# Patient Record
Sex: Male | Born: 1985 | Race: White | Hispanic: No | Marital: Married | Smoking: Never smoker
Health system: Southern US, Community
[De-identification: ages and names within clinical notes are randomized; demographics above are authoritative.]

---

## 2003-10-12 ENCOUNTER — Emergency Department (HOSPITAL_COMMUNITY): Admission: EM | Admit: 2003-10-12 | Discharge: 2003-10-13 | Payer: Self-pay | Admitting: Emergency Medicine

## 2005-11-29 ENCOUNTER — Emergency Department (HOSPITAL_COMMUNITY): Admission: EM | Admit: 2005-11-29 | Discharge: 2005-11-29 | Payer: Self-pay | Admitting: Emergency Medicine

## 2008-03-20 ENCOUNTER — Emergency Department (HOSPITAL_COMMUNITY): Admission: EM | Admit: 2008-03-20 | Discharge: 2008-03-20 | Payer: Self-pay | Admitting: Family Medicine

## 2008-04-01 ENCOUNTER — Encounter: Admission: RE | Admit: 2008-04-01 | Discharge: 2008-05-11 | Payer: Self-pay | Admitting: Orthopedic Surgery

## 2009-01-28 ENCOUNTER — Emergency Department (HOSPITAL_COMMUNITY): Admission: EM | Admit: 2009-01-28 | Discharge: 2009-01-28 | Payer: Self-pay | Admitting: Emergency Medicine

## 2010-04-01 ENCOUNTER — Emergency Department (HOSPITAL_COMMUNITY)
Admission: EM | Admit: 2010-04-01 | Discharge: 2010-04-01 | Payer: Self-pay | Source: Home / Self Care | Admitting: Family Medicine

## 2010-08-29 ENCOUNTER — Inpatient Hospital Stay (INDEPENDENT_AMBULATORY_CARE_PROVIDER_SITE_OTHER)
Admission: RE | Admit: 2010-08-29 | Discharge: 2010-08-29 | Disposition: A | Discharge status: Home / Self Care | Payer: BC Managed Care – PPO | Source: Ambulatory Visit | Attending: Family Medicine | Admitting: Family Medicine

## 2010-08-29 DIAGNOSIS — H9209 Otalgia, unspecified ear: Secondary | ICD-10-CM

## 2010-08-29 DIAGNOSIS — S058X9A Other injuries of unspecified eye and orbit, initial encounter: Secondary | ICD-10-CM

## 2010-08-29 DIAGNOSIS — J069 Acute upper respiratory infection, unspecified: Secondary | ICD-10-CM

## 2015-03-10 ENCOUNTER — Other Ambulatory Visit: Payer: Self-pay | Admitting: Family Medicine

## 2015-03-10 ENCOUNTER — Ambulatory Visit
Admission: RE | Admit: 2015-03-10 | Discharge: 2015-03-10 | Disposition: A | Discharge status: Home / Self Care | Payer: BLUE CROSS/BLUE SHIELD | Source: Ambulatory Visit | Attending: Family Medicine | Admitting: Family Medicine

## 2015-03-10 DIAGNOSIS — R52 Pain, unspecified: Secondary | ICD-10-CM

## 2015-03-10 DIAGNOSIS — M25531 Pain in right wrist: Secondary | ICD-10-CM | POA: Insufficient documentation

## 2015-10-28 ENCOUNTER — Encounter: Payer: Self-pay | Admitting: Emergency Medicine

## 2015-10-28 ENCOUNTER — Emergency Department: Payer: Worker's Compensation

## 2015-10-28 ENCOUNTER — Emergency Department
Admission: EM | Admit: 2015-10-28 | Discharge: 2015-10-28 | Disposition: A | Discharge status: Home / Self Care | Payer: Worker's Compensation | Attending: Emergency Medicine | Admitting: Emergency Medicine

## 2015-10-28 DIAGNOSIS — Y939 Activity, unspecified: Secondary | ICD-10-CM | POA: Insufficient documentation

## 2015-10-28 DIAGNOSIS — Y929 Unspecified place or not applicable: Secondary | ICD-10-CM | POA: Diagnosis not present

## 2015-10-28 DIAGNOSIS — S5011XA Contusion of right forearm, initial encounter: Secondary | ICD-10-CM | POA: Insufficient documentation

## 2015-10-28 DIAGNOSIS — Y999 Unspecified external cause status: Secondary | ICD-10-CM | POA: Diagnosis not present

## 2015-10-28 DIAGNOSIS — X58XXXA Exposure to other specified factors, initial encounter: Secondary | ICD-10-CM | POA: Insufficient documentation

## 2015-10-28 DIAGNOSIS — R2231 Localized swelling, mass and lump, right upper limb: Secondary | ICD-10-CM | POA: Diagnosis present

## 2015-10-28 DIAGNOSIS — T754XXA Electrocution, initial encounter: Secondary | ICD-10-CM | POA: Insufficient documentation
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
## 2015-11-16 ENCOUNTER — Other Ambulatory Visit: Payer: Self-pay | Admitting: Family

## 2015-11-16 ENCOUNTER — Ambulatory Visit
Admission: RE | Admit: 2015-11-16 | Discharge: 2015-11-16 | Disposition: A | Discharge status: Home / Self Care | Payer: Worker's Compensation | Source: Ambulatory Visit | Attending: Family | Admitting: Family

## 2015-11-16 DIAGNOSIS — M25512 Pain in left shoulder: Secondary | ICD-10-CM | POA: Diagnosis present

## 2015-11-16 DIAGNOSIS — R52 Pain, unspecified: Secondary | ICD-10-CM

## 2015-12-15 DIAGNOSIS — M25519 Pain in unspecified shoulder: Secondary | ICD-10-CM | POA: Insufficient documentation
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
## 2017-12-25 ENCOUNTER — Other Ambulatory Visit: Payer: Self-pay | Admitting: Physician Assistant

## 2017-12-25 DIAGNOSIS — N5089 Other specified disorders of the male genital organs: Secondary | ICD-10-CM

## 2018-01-01 ENCOUNTER — Ambulatory Visit: Payer: BLUE CROSS/BLUE SHIELD

## 2018-08-21 ENCOUNTER — Other Ambulatory Visit: Payer: Self-pay

## 2018-08-21 ENCOUNTER — Telehealth: Payer: Self-pay | Admitting: *Deleted

## 2018-08-21 DIAGNOSIS — Z20822 Contact with and (suspected) exposure to covid-19: Secondary | ICD-10-CM

## 2018-08-21 NOTE — Telephone Encounter (Signed)
Pt called and scheduled for testing at [facility] site on 08/21/18 at 3:15 pm. Pt advised to wear a mask and remain in the car at the time of appt. Understanding verbalized.
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
## 2018-08-23 LAB — NOVEL CORONAVIRUS, NAA: SARS-CoV-2, NAA: NOT DETECTED

## 2018-11-22 DIAGNOSIS — Z23 Encounter for immunization: Secondary | ICD-10-CM | POA: Diagnosis not present

## 2018-11-26 ENCOUNTER — Ambulatory Visit: Payer: 59 | Admitting: Internal Medicine

## 2018-11-26 ENCOUNTER — Other Ambulatory Visit: Payer: Self-pay

## 2018-11-26 ENCOUNTER — Encounter: Payer: Self-pay | Admitting: Internal Medicine

## 2018-11-26 VITALS — BP 127/90 | HR 75 | Temp 98.3°F | Resp 12 | Ht 75.0 in | Wt 229.0 lb

## 2018-11-26 DIAGNOSIS — H93239 Hyperacusis, unspecified ear: Secondary | ICD-10-CM | POA: Insufficient documentation

## 2018-11-26 DIAGNOSIS — H6121 Impacted cerumen, right ear: Secondary | ICD-10-CM

## 2018-11-26 DIAGNOSIS — H6123 Impacted cerumen, bilateral: Secondary | ICD-10-CM

## 2018-11-26 NOTE — Progress Notes (Signed)
S - 33 y.o. male who presents with right ear blockage, not had irrigated in the past here but was told on his last physical by Dr.R that there was some wax blocking and he bought the PTC drops and used with the bulb syringe and not all that helpful. Now notes more blockage and decreased hearing from the right ear. Has not used drops in the left ear.  Not painful. Denies PND, sinus congestion, sore throat, or cough no fevers, no sx's of concern for Covid Hearing decreased some from the right ear and not noticed in left Uses ear plugs at work at times, does not use Q-tips  No Known Allergies  No current outpatient medications on file prior to visit.   No current facility-administered medications on file prior to visit.    No h/o high BP Never smoker  O - NAD, masked  BP 127/90 (BP Location: Right Arm, Patient Position: Sitting, Cuff Size: Large)   Pulse 75   Temp 98.3 F (36.8 C) (Oral)   Resp 12   Ht '6\' 3"'  (1.905 m)   Wt 229 lb (103.9 kg)   SpO2 100%   BMI 28.62 kg/m   BP on last PE 2019 was 110/78  HEENT - conj - non-inj'ed,  Left TM with cerumen, not visualize TM, Right TM also not visualized with cerumen in canal  NT tugging on Right lobe, NT tugging on Left lobe Neck - no subauricular adenopathy, no increased anterior cervical nodes or posterior cervical nodes, no rigidity Affect not flat, approp with conversation  Ass/Plan 1: cerumen impaction - bilat  Discussed the procedure of irrigation with warm water with device have in office and patient agreed to proceed and this was done without incident on both the right and left ears A large amount of cerumen was successfully irrigated from both ears Exam after noted min erythema in the canals (likely from procedure), TM's clear bilat  Also, noted could hear now much better from the right ear and felt it open up on the left No dizziness or lightheadedness after and had stand and ambulate and no concerns noted by patient Rec'ed  using the OTC ear wax removal kit every 2-4 weeks to help prevent re-accumulation and impaction, and can use the drops for 3 days, then the bulb syringe with warm water to irrigate.   2. Borderline BP elevation noted today  Will continue to monitor presently F/u prn

## 2018-11-26 NOTE — Progress Notes (Signed)
Has been using OTC ear wax softener

## 2019-02-20 ENCOUNTER — Other Ambulatory Visit: Payer: Self-pay | Admitting: Physician Assistant

## 2019-02-20 DIAGNOSIS — N5089 Other specified disorders of the male genital organs: Secondary | ICD-10-CM

## 2019-03-03 ENCOUNTER — Ambulatory Visit: Payer: Managed Care, Other (non HMO) | Attending: Internal Medicine

## 2019-03-03 ENCOUNTER — Other Ambulatory Visit: Payer: Self-pay

## 2019-03-03 DIAGNOSIS — Z20822 Contact with and (suspected) exposure to covid-19: Secondary | ICD-10-CM

## 2019-03-04 LAB — NOVEL CORONAVIRUS, NAA: SARS-CoV-2, NAA: NOT DETECTED

## 2019-05-14 ENCOUNTER — Ambulatory Visit
Admission: RE | Admit: 2019-05-14 | Discharge: 2019-05-14 | Disposition: A | Discharge status: Home / Self Care | Payer: 59 | Attending: Physician Assistant | Admitting: Physician Assistant

## 2019-05-14 ENCOUNTER — Ambulatory Visit: Payer: Self-pay | Admitting: Physician Assistant

## 2019-05-14 ENCOUNTER — Ambulatory Visit
Admission: RE | Admit: 2019-05-14 | Discharge: 2019-05-14 | Disposition: A | Discharge status: Home / Self Care | Payer: 59 | Source: Ambulatory Visit | Attending: Physician Assistant | Admitting: Physician Assistant

## 2019-05-14 ENCOUNTER — Other Ambulatory Visit: Payer: Self-pay

## 2019-05-14 VITALS — BP 134/92 | HR 72 | Temp 98.8°F | Ht 75.0 in | Wt 218.0 lb

## 2019-05-14 DIAGNOSIS — M549 Dorsalgia, unspecified: Secondary | ICD-10-CM | POA: Insufficient documentation

## 2019-05-14 DIAGNOSIS — M545 Low back pain, unspecified: Secondary | ICD-10-CM

## 2019-05-14 DIAGNOSIS — G8929 Other chronic pain: Secondary | ICD-10-CM

## 2019-05-14 MED ORDER — CYCLOBENZAPRINE HCL 10 MG PO TABS: 10.0000 mg | ORAL_TABLET | Freq: Three times a day (TID) | ORAL | 0 refills | Status: DC | PRN

## 2019-05-14 NOTE — Progress Notes (Signed)
   Subjective: Chronic back pain    Patient ID: Christyan Reger, male    DOB: October 03, 1985, 34 y.o.   MRN: [id number]  HPI Patient complain chronic left lateral back pain x6 months.  Onset incident while running and stepping off a curb and he felt a "jolt" in his back.  Patient is having intimating muscle spasms left lateral back.  Patient denies radicular component to his back pain.  Patient denies bladder/ bowel dysfunction.   Review of Systems Negative except for complaint.    Objective:   Physical Exam No obvious spinal deformity.  Patient is moderate guarding palpation of the L3 and L4.  Mild muscle spasms with right lateral movements and extension.       Assessment & Plan: Chronic back pain  Patient follow-up status post imaging the lumbar spine.  Patient given a prescription for Flexeril to use on a as needed basis.  Patient advised of drowsy effect of these medications.

## 2019-05-25 ENCOUNTER — Ambulatory Visit: Payer: Self-pay | Admitting: Physician Assistant

## 2019-05-25 ENCOUNTER — Other Ambulatory Visit: Payer: Self-pay

## 2019-05-25 VITALS — BP 137/86 | HR 67 | Temp 97.8°F | Wt 220.0 lb

## 2019-05-25 DIAGNOSIS — G8929 Other chronic pain: Secondary | ICD-10-CM | POA: Insufficient documentation

## 2019-05-25 DIAGNOSIS — M549 Dorsalgia, unspecified: Secondary | ICD-10-CM

## 2019-05-25 NOTE — Progress Notes (Signed)
   Subjective: Upper back pain    Patient ID: Nathaniel Walker, male    DOB: [birth year], 34 y.o.   MRN: [id number]  HPI Patient presents for reevaluation of chronic upper back pain.  Patient had x-rays performed since last visit.  Patient is asymptomatic at this time.   Review of Systems Negative except for complaint.    Objective:   Physical Exam No acute distress.  No obvious deformity to the upper back.  Patient has mild guarding palpation of T10-12.  Patient has full equal range of motion of upper extremities.  Patient full neck range of motion of the lumbar spine.  X-rays reveal mild spurring of T12 and L1.       Assessment & Plan: Upper back pain  Discussed x-ray findings with patient.  Patient given discharge care instruction advised continue use of muscle relaxer anti-inflammatory medicine as needed.  Follow-up if condition worsens.

## 2019-06-03 DIAGNOSIS — M545 Low back pain: Secondary | ICD-10-CM | POA: Diagnosis not present

## 2019-06-15 DIAGNOSIS — M545 Low back pain: Secondary | ICD-10-CM | POA: Diagnosis not present

## 2019-07-06 DIAGNOSIS — M545 Low back pain: Secondary | ICD-10-CM | POA: Diagnosis not present

## 2019-08-25 NOTE — Progress Notes (Signed)
Scheduled to complete physical 08/31/19 with Dorris Carnes.  AMD

## 2019-08-26 ENCOUNTER — Ambulatory Visit: Payer: Self-pay

## 2019-08-26 ENCOUNTER — Other Ambulatory Visit: Payer: Self-pay

## 2019-08-26 DIAGNOSIS — Z0000 Encounter for general adult medical examination without abnormal findings: Secondary | ICD-10-CM

## 2019-08-26 LAB — POCT URINALYSIS DIPSTICK
Bilirubin, UA: NEGATIVE
Blood, UA: NEGATIVE
Glucose, UA: NEGATIVE
Ketones, UA: NEGATIVE
Leukocytes, UA: NEGATIVE
Nitrite, UA: NEGATIVE
Protein, UA: NEGATIVE
Spec Grav, UA: >=1.03 — AB (ref 1.010–1.025)
Urobilinogen, UA: 0.2 E.U./dL
pH, UA: 6 (ref 5.0–8.0)

## 2019-08-27 LAB — CMP12+LP+TP+TSH+6AC+CBC/D/PLT
ALT: 20 IU/L (ref 0–44)
AST: 19 IU/L (ref 0–40)
Albumin/Globulin Ratio: 1.7 (ref 1.2–2.2)
Albumin: 4.5 g/dL (ref 4.0–5.0)
Alkaline Phosphatase: 47 IU/L — ABNORMAL LOW (ref 48–121)
BUN/Creatinine Ratio: 15 (ref 9–20)
BUN: 18 mg/dL (ref 6–20)
Basophils Absolute: 0.1 10*3/uL (ref 0.0–0.2)
Basos: 1 %
Bilirubin Total: 1 mg/dL (ref 0.0–1.2)
Calcium: 9.3 mg/dL (ref 8.7–10.2)
Chloride: 106 mmol/L (ref 96–106)
Chol/HDL Ratio: 3.5 ratio (ref 0.0–5.0)
Cholesterol, Total: 200 mg/dL — ABNORMAL HIGH (ref 100–199)
Creatinine, Ser: 1.17 mg/dL (ref 0.76–1.27)
EOS (ABSOLUTE): 0.2 10*3/uL (ref 0.0–0.4)
Eos: 4 %
Estimated CHD Risk: 0.6 times avg. (ref 0.0–1.0)
Free Thyroxine Index: 2.6 (ref 1.2–4.9)
GFR calc Af Amer: 92 mL/min/{1.73_m2} (ref >59)
GFR calc non Af Amer: 80 mL/min/{1.73_m2} (ref >59)
GGT: 15 IU/L (ref 0–65)
Globulin, Total: 2.6 g/dL (ref 1.5–4.5)
Glucose: 93 mg/dL (ref 65–99)
HDL: 57 mg/dL (ref >39)
Hematocrit: 47.4 % (ref 37.5–51.0)
Hemoglobin: 15.3 g/dL (ref 13.0–17.7)
Immature Grans (Abs): 0 10*3/uL (ref 0.0–0.1)
Immature Granulocytes: 0 %
Iron: 88 ug/dL (ref 38–169)
LDH: 198 IU/L (ref 121–224)
LDL Chol Calc (NIH): 130 mg/dL — ABNORMAL HIGH (ref 0–99)
Lymphocytes Absolute: 1.9 10*3/uL (ref 0.7–3.1)
Lymphs: 33 %
MCH: 28.1 pg (ref 26.6–33.0)
MCHC: 32.3 g/dL (ref 31.5–35.7)
MCV: 87 fL (ref 79–97)
Monocytes Absolute: 0.7 10*3/uL (ref 0.1–0.9)
Monocytes: 12 %
Neutrophils Absolute: 2.9 10*3/uL (ref 1.4–7.0)
Neutrophils: 50 %
Phosphorus: 3.3 mg/dL (ref 2.8–4.1)
Platelets: 276 10*3/uL (ref 150–450)
Potassium: 5.1 mmol/L (ref 3.5–5.2)
RBC: 5.45 x10E6/uL (ref 4.14–5.80)
RDW: 12.3 % (ref 11.6–15.4)
Sodium: 144 mmol/L (ref 134–144)
T3 Uptake Ratio: 29 % (ref 24–39)
T4, Total: 9 ug/dL (ref 4.5–12.0)
TSH: 7.25 u[IU]/mL — ABNORMAL HIGH (ref 0.450–4.500)
Total Protein: 7.1 g/dL (ref 6.0–8.5)
Triglycerides: 71 mg/dL (ref 0–149)
Uric Acid: 6 mg/dL (ref 3.8–8.4)
VLDL Cholesterol Cal: 13 mg/dL (ref 5–40)
WBC: 5.8 10*3/uL (ref 3.4–10.8)

## 2019-08-31 ENCOUNTER — Encounter: Payer: Self-pay | Admitting: Emergency Medicine

## 2019-08-31 ENCOUNTER — Ambulatory Visit: Payer: Self-pay | Admitting: Emergency Medicine

## 2019-08-31 ENCOUNTER — Other Ambulatory Visit: Payer: Self-pay

## 2019-08-31 VITALS — BP 125/91 | HR 78 | Temp 98.8°F | Resp 16 | Ht 75.0 in | Wt 215.0 lb

## 2019-08-31 DIAGNOSIS — R7989 Other specified abnormal findings of blood chemistry: Secondary | ICD-10-CM

## 2019-08-31 DIAGNOSIS — Z0000 Encounter for general adult medical examination without abnormal findings: Secondary | ICD-10-CM

## 2019-08-31 NOTE — Progress Notes (Signed)
   I have reviewed the triage vital signs and the nursing notes.   HISTORY  Chief Complaint Employment Physical   HPI Nathaniel Walker is a 34 y.o. male is here for his annual physical.  He denies any medical difficulties.  It was noted last week when reviewing his lab work that his TSH was elevated at 7.25.  Patient states that this happened approximately 3 to 4 years ago and was repeated and he has been in normal limits since that time.  He is not on any medication.  He reports that for the last year he has dieted and exercised and was able to lose 15 pounds reasonably.  He denies any other symptoms.        History reviewed. No pertinent past medical history.  Patient Active Problem List   Diagnosis Date Noted  . Chronic upper back pain 05/25/2019  . Back pain 05/14/2019  . Hearing abnormally acute 11/26/2018  . Bilateral impacted cerumen 11/26/2018  . Hearing loss of right ear due to cerumen impaction 11/26/2018  . Shoulder pain 12/15/2015    History reviewed. No pertinent surgical history.  Prior to Admission medications   Not on File    Allergies Patient has no known allergies.  History reviewed. No pertinent family history.  Social History Social History   Tobacco Use  . Smoking status: Never Smoker  . Smokeless tobacco: Never Used  Substance Use Topics  . Alcohol use: No  . Drug use: Not on file    Review of Systems Constitutional: No fever/chills Eyes: No visual changes. ENT: No sore throat. Cardiovascular: Denies chest pain. Respiratory: Denies shortness of breath. Gastrointestinal: No abdominal pain.  No nausea, no vomiting.  No diarrhea.  No constipation. Genitourinary: Negative for dysuria. Musculoskeletal:  Skin: Negative for rash. Neurological: Negative for headaches, focal weakness or numbness.  ____________________________________________   PHYSICAL EXAM:  Constitutional: Alert and oriented. Well appearing and in no acute distress. Eyes:  Conjunctivae are normal. PERRL. EOMI. Head: Atraumatic. Nose: No congestion/rhinnorhea. Neck: No stridor.   Cardiovascular: Normal rate, regular rhythm. Grossly normal heart sounds.  Good peripheral circulation. Respiratory: Normal respiratory effort.  No retractions. Lungs CTAB. Gastrointestinal: Soft and nontender. No distention.  Bowel sounds are normoactive x4 quadrants. Musculoskeletal: Nontender thoracic or lumbar spine.  Moves upper and lower extremities without any difficulty.  Good muscle strength bilaterally.  Normal gait was noted. Neurologic:  Normal speech and language. No gross focal neurologic deficits are appreciated. No gait instability. Skin:  Skin is warm, dry and intact. No rash noted. Psychiatric: Mood and affect are normal. Speech and behavior are normal.  ____________________________________________   LABS (all labs ordered are listed, but only abnormal results are displayed) Lab work is discussed with patient.   FINAL CLINICAL IMPRESSION(S) Annual physical. History of elevated TSH in the past.  With patient's history will repeat in 1 month.  Also will compare with lab work and his paper chart.   ED Discharge Orders    None       Note:  This document was prepared using Dragon voice recognition software and may include unintentional dictation errors.

## 2019-09-30 ENCOUNTER — Other Ambulatory Visit: Payer: Self-pay

## 2019-09-30 ENCOUNTER — Ambulatory Visit: Payer: Self-pay | Admitting: Emergency Medicine

## 2019-09-30 VITALS — BP 129/87 | HR 70 | Temp 98.4°F | Resp 14 | Ht 75.0 in | Wt 215.0 lb

## 2019-09-30 DIAGNOSIS — R7989 Other specified abnormal findings of blood chemistry: Secondary | ICD-10-CM

## 2019-09-30 DIAGNOSIS — J019 Acute sinusitis, unspecified: Secondary | ICD-10-CM

## 2019-09-30 MED ORDER — PREDNISONE 50 MG PO TABS: ORAL_TABLET | ORAL | 0 refills | Status: DC

## 2019-09-30 MED ORDER — AMOXICILLIN-POT CLAVULANATE 875-125 MG PO TABS: 1.0000 | ORAL_TABLET | Freq: Two times a day (BID) | ORAL | 0 refills | Status: AC

## 2019-09-30 NOTE — Progress Notes (Signed)
  ER Provider Note       Time seen: 9:12 AM   I have reviewed the vital signs and the nursing notes.  HISTORY   Chief Complaint Sinusitis   HPI Nathaniel Walker is a 34 y.o. male with a history of no significant past medical history who presents today for sinus congestion with pressure and increased mucus for the last 10 days.  He has had a slight fever several days ago.  He has not lost his taste or smell.  No past medical history on file.  No past surgical history on file.  Allergies Patient has no known allergies.   Review of Systems Constitutional: Negative for fever. HEENT: Positive for sinus pressure Cardiovascular: Negative for chest pain. Respiratory: Negative for shortness of breath. Musculoskeletal: Negative for back pain. Skin: Negative for rash. Neurological: Negative for headaches, focal weakness or numbness.  All systems negative/normal/unremarkable except as stated in the HPI  ____________________________________________   PHYSICAL EXAM:  VITAL SIGNS: Vitals:   09/30/19 0847  BP: 129/87  Pulse: 70  Resp: 14  Temp: 98.4 F (36.9 C)  SpO2: 97%    Constitutional: Alert and oriented. Well appearing and in no distress. Eyes: Conjunctivae are normal. Normal extraocular movements. ENT      Head: Normocephalic and atraumatic.  Pressure over the maxillary sinuses bilaterally.  TMs are clear      Nose: No congestion/rhinnorhea.      Mouth/Throat: Mucous membranes are moist.      Neck: No stridor. Cardiovascular: Normal rate, regular rhythm. No murmurs, rubs, or gallops. Respiratory: Normal respiratory effort without tachypnea nor retractions. Breath sounds are clear and equal bilaterally. No wheezes/rales/rhonchi. Gastrointestinal: Soft and nontender. Normal bowel sounds Musculoskeletal: Nontender with normal range of motion in extremities. No lower extremity tenderness nor edema. Neurologic:  Normal speech and language. No gross focal neurologic  deficits are appreciated.  Skin:  Skin is warm, dry and intact. No rash noted. Psychiatric: Speech and behavior are normal.    DIFFERENTIAL DIAGNOSIS  Acute sinusitis, seasonal allergy  ASSESSMENT AND PLAN  Acute sinusitis   Plan: The patient had presented for sinus symptoms for the past 10 days.  At this point I do think it is reasonable to treat with steroids and antibiotics.  He is encouraged to return for worsening or worrisome symptoms.  Daryel November MD    Note: This note was generated in part or whole with voice recognition software. Voice recognition is usually quite accurate but there are transcription errors that can and very often do occur. I apologize for any typographical errors that were not detected and corrected.

## 2019-09-30 NOTE — Progress Notes (Signed)
Pt has had sinus congestion, pressure, and increase mucous (yellow/thick) for 10 days. No fever, slight low grade fever 4 days ago. 99.5

## 2019-10-01 LAB — THYROID PANEL WITH TSH
Free Thyroxine Index: 2.1 (ref 1.2–4.9)
T3 Uptake Ratio: 29 % (ref 24–39)
T4, Total: 7.2 ug/dL (ref 4.5–12.0)
TSH: 1.47 u[IU]/mL (ref 0.450–4.500)

## 2019-10-06 ENCOUNTER — Ambulatory Visit: Payer: 59

## 2020-01-01 ENCOUNTER — Other Ambulatory Visit: Payer: Self-pay

## 2020-01-01 ENCOUNTER — Encounter: Payer: Self-pay | Admitting: Physician Assistant

## 2020-01-01 ENCOUNTER — Ambulatory Visit: Payer: Self-pay | Admitting: Physician Assistant

## 2020-01-01 VITALS — BP 133/87 | HR 74 | Temp 98.1°F | Resp 12

## 2020-01-01 DIAGNOSIS — M25411 Effusion, right shoulder: Secondary | ICD-10-CM

## 2020-01-01 DIAGNOSIS — L989 Disorder of the skin and subcutaneous tissue, unspecified: Secondary | ICD-10-CM

## 2020-01-01 DIAGNOSIS — N5089 Other specified disorders of the male genital organs: Secondary | ICD-10-CM

## 2020-01-01 NOTE — Addendum Note (Signed)
Addended by: Gardner Candle on: 01/01/2020 03:01 PM   Modules accepted: Orders

## 2020-01-01 NOTE — Progress Notes (Signed)
Swollen area on backside of right shoulder - 1st noticed it at the end of last week. Thinks the swelling is the same size as when 1st noticed. Denies pain to area. No known injury  AMD

## 2020-01-01 NOTE — Progress Notes (Signed)
   Subjective: Skin lesion    Patient ID: Nathaniel Walker, male    DOB: Oct 31, 1985, 34 y.o.   MRN: [id number]  HPI Patient presents for nodule lesion posterior right upper shoulder for 1 week.  Area is nontender to palpation/pressure.  No redness.  No provocative incident for complaint.   Review of Systems     Negative Objective:   Physical Exam No acute distress.  Nodular lesion posterior right shoulder region approximately 5 cm x 3 cm.  Area is soft and nontender to palpation.  Patient is mobile.       Assessment & Plan: Lipoma versus sebaceous cyst.  Patient is amenable to conservative approach for 1 week.  Advised warm compresses to area and follow-up in 1 week.  Return back sooner if condition worsens.  Area was circled for a skin marker.

## 2020-01-07 NOTE — Addendum Note (Signed)
Addended by: Gardner Candle on: 01/07/2020 03:06 PM   Modules accepted: Orders

## 2020-01-08 ENCOUNTER — Ambulatory Visit: Payer: Self-pay

## 2020-01-08 ENCOUNTER — Other Ambulatory Visit: Payer: Self-pay

## 2020-01-08 DIAGNOSIS — M25411 Effusion, right shoulder: Secondary | ICD-10-CM

## 2020-01-08 NOTE — Progress Notes (Signed)
Nathaniel Walker presents to have swelling on right shoulder re-evaluated.  States it looks the same size to him & maybe a little less swollen than last week.  The sharpie marking of the perimeter by  Nathaniel Parcel, PA-C is no longer there. Appears to be the same size as last week.  It has not increased in size.  Nathaniel Walker states it's not bothering him.  No pain or discomfort.  AMD

## 2020-03-03 DIAGNOSIS — S61211A Laceration without foreign body of left index finger without damage to nail, initial encounter: Secondary | ICD-10-CM | POA: Diagnosis not present

## 2020-03-08 ENCOUNTER — Ambulatory Visit: Payer: Self-pay | Admitting: Physician Assistant

## 2020-03-08 ENCOUNTER — Other Ambulatory Visit: Payer: Self-pay

## 2020-03-08 ENCOUNTER — Encounter: Payer: Self-pay | Admitting: Physician Assistant

## 2020-03-08 VITALS — BP 130/81 | HR 78 | Temp 98.4°F | Resp 14 | Ht 75.0 in | Wt 220.0 lb

## 2020-03-08 DIAGNOSIS — S61211S Laceration without foreign body of left index finger without damage to nail, sequela: Secondary | ICD-10-CM

## 2020-03-08 NOTE — Progress Notes (Signed)
° °  Subjective: Finger laceration    Patient ID: Nathaniel Walker, male    DOB: [birth year], 34 y.o.   MRN: [id number]  HPI Patient for follow-up  Laceration which occurred on 03/03/2020.  Patient was seen at urgent care clinic and they sutured his finger.  Patient is here for work status.  Denies loss of sensation.  Patient states mild pain with flexion of the affected left index finger.   Review of Systems    Negative except for complaint.   Objective:   Physical Exam   No acute distress.  No signs of secondary infection wound site of the left index finger.     Assessment & Plan: Healing finger laceration.  Patient advised to return back on 03/14/2020 for suture removal.  Patient placed on work restriction pending suture removal.

## 2020-03-14 ENCOUNTER — Encounter: Payer: Self-pay | Admitting: Nurse Practitioner

## 2020-03-14 ENCOUNTER — Ambulatory Visit: Payer: Self-pay | Admitting: Nurse Practitioner

## 2020-03-14 ENCOUNTER — Other Ambulatory Visit: Payer: Self-pay

## 2020-03-14 VITALS — BP 127/82 | HR 62 | Temp 98.7°F | Resp 12

## 2020-03-14 DIAGNOSIS — Z4802 Encounter for removal of sutures: Secondary | ICD-10-CM

## 2020-03-14 NOTE — Progress Notes (Signed)
Sutures were put in on 03/03/20.
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
## 2020-03-30 DIAGNOSIS — Z03818 Encounter for observation for suspected exposure to other biological agents ruled out: Secondary | ICD-10-CM | POA: Diagnosis not present

## 2020-03-30 DIAGNOSIS — Z20822 Contact with and (suspected) exposure to covid-19: Secondary | ICD-10-CM | POA: Diagnosis not present

## 2020-04-03 DIAGNOSIS — J018 Other acute sinusitis: Secondary | ICD-10-CM | POA: Diagnosis not present

## 2020-07-20 ENCOUNTER — Ambulatory Visit: Payer: Self-pay

## 2020-07-20 ENCOUNTER — Other Ambulatory Visit: Payer: Self-pay

## 2020-07-20 DIAGNOSIS — Z0000 Encounter for general adult medical examination without abnormal findings: Secondary | ICD-10-CM

## 2020-07-20 LAB — POCT URINALYSIS DIPSTICK
Bilirubin, UA: NEGATIVE
Blood, UA: NEGATIVE
Glucose, UA: NEGATIVE
Ketones, UA: NEGATIVE
Leukocytes, UA: NEGATIVE
Protein, UA: NEGATIVE
Spec Grav, UA: 1.025 (ref 1.010–1.025)
Urobilinogen, UA: 0.2 E.U./dL
pH, UA: 6 (ref 5.0–8.0)

## 2020-07-20 NOTE — Progress Notes (Signed)
Scheduled to complete physical 07/28/20 with Ron Smith, PA-C.  AMD 

## 2020-07-21 LAB — CMP12+LP+TP+TSH+6AC+CBC/D/PLT
ALT: 38 IU/L (ref 0–44)
AST: 27 IU/L (ref 0–40)
Albumin/Globulin Ratio: 1.9 (ref 1.2–2.2)
Albumin: 4.6 g/dL (ref 4.0–5.0)
Alkaline Phosphatase: 50 IU/L (ref 44–121)
BUN/Creatinine Ratio: 17 (ref 9–20)
BUN: 19 mg/dL (ref 6–20)
Basophils Absolute: 0 10*3/uL (ref 0.0–0.2)
Basos: 1 %
Bilirubin Total: 1.5 mg/dL — ABNORMAL HIGH (ref 0.0–1.2)
Calcium: 9.3 mg/dL (ref 8.7–10.2)
Chloride: 106 mmol/L (ref 96–106)
Chol/HDL Ratio: 3.5 ratio (ref 0.0–5.0)
Cholesterol, Total: 223 mg/dL — ABNORMAL HIGH (ref 100–199)
Creatinine, Ser: 1.1 mg/dL (ref 0.76–1.27)
EOS (ABSOLUTE): 0.2 10*3/uL (ref 0.0–0.4)
Eos: 3 %
Estimated CHD Risk: 0.6 times avg. (ref 0.0–1.0)
Free Thyroxine Index: 2.4 (ref 1.2–4.9)
GGT: 18 IU/L (ref 0–65)
Globulin, Total: 2.4 g/dL (ref 1.5–4.5)
Glucose: 98 mg/dL (ref 65–99)
HDL: 63 mg/dL (ref >39)
Hematocrit: 50 % (ref 37.5–51.0)
Hemoglobin: 15.8 g/dL (ref 13.0–17.7)
Immature Grans (Abs): 0 10*3/uL (ref 0.0–0.1)
Immature Granulocytes: 0 %
Iron: 120 ug/dL (ref 38–169)
LDH: 212 IU/L (ref 121–224)
LDL Chol Calc (NIH): 150 mg/dL — ABNORMAL HIGH (ref 0–99)
Lymphocytes Absolute: 2 10*3/uL (ref 0.7–3.1)
Lymphs: 33 %
MCH: 27.4 pg (ref 26.6–33.0)
MCHC: 31.6 g/dL (ref 31.5–35.7)
MCV: 87 fL (ref 79–97)
Monocytes Absolute: 0.5 10*3/uL (ref 0.1–0.9)
Monocytes: 8 %
Neutrophils Absolute: 3.4 10*3/uL (ref 1.4–7.0)
Neutrophils: 55 %
Phosphorus: 3.1 mg/dL (ref 2.8–4.1)
Platelets: 306 10*3/uL (ref 150–450)
Potassium: 4.8 mmol/L (ref 3.5–5.2)
RBC: 5.76 x10E6/uL (ref 4.14–5.80)
RDW: 11.9 % (ref 11.6–15.4)
Sodium: 144 mmol/L (ref 134–144)
T3 Uptake Ratio: 28 % (ref 24–39)
T4, Total: 8.6 ug/dL (ref 4.5–12.0)
TSH: 6.37 u[IU]/mL — ABNORMAL HIGH (ref 0.450–4.500)
Total Protein: 7 g/dL (ref 6.0–8.5)
Triglycerides: 59 mg/dL (ref 0–149)
Uric Acid: 5.5 mg/dL (ref 3.8–8.4)
VLDL Cholesterol Cal: 10 mg/dL (ref 5–40)
WBC: 6.1 10*3/uL (ref 3.4–10.8)
eGFR: 89 mL/min/{1.73_m2} (ref >59)

## 2020-07-29 ENCOUNTER — Ambulatory Visit: Payer: Self-pay | Admitting: Physician Assistant

## 2020-07-29 ENCOUNTER — Encounter: Payer: Self-pay | Admitting: Physician Assistant

## 2020-07-29 ENCOUNTER — Other Ambulatory Visit: Payer: Self-pay

## 2020-07-29 VITALS — BP 131/94 | HR 70 | Temp 98.6°F | Resp 16 | Ht 75.0 in | Wt 215.0 lb

## 2020-07-29 DIAGNOSIS — Z0000 Encounter for general adult medical examination without abnormal findings: Secondary | ICD-10-CM

## 2020-07-29 NOTE — Progress Notes (Signed)
   Subjective: Annual physical exam    Patient ID: Nathaniel Walker, male    DOB: 1986-02-03, 35 y.o.   MRN: [id number]  HPI Patient presents for annual physical exam voices no concerns or complaints.   Review of Systems    Negative Objective:   Physical Exam No acute distress.  Temperature 98.6, pulse 70, respirations 16, BP is 131/94, patient 90% O2 sat on room air. HEENT is unremarkable.  Neck is supple for adenopathy or bruits.  Lungs are clear to auscultation.  Heart regular rate and rhythm. Abdomen with negative HSM, normoactive bowel sounds, soft, nontender to palpation. No obvious deformity to the upper or lower extremities.  Patient has full and equal range of motion of the upper or lower extremities. No obvious cervical or lumbar spine deformity.  Patient has full and equal range of motion cervical lumbar spine. Cranial nerves II through XII grossly intact.  DTRs 2+ without clonus.       Assessment & Plan: Well exam.  Patient had a slight increase of his cholesterol 11 months ago was 200 today 223.  LDL is 150 which is is an increase from 130.  HDL is 59. It was noticed again that the patient TSH was elevated 7.25 last year and a repeat the next day was 1.470.  Today lab results show TSH is 6.370.  Patient asymptomatic.  We will repeat test in 3 to 6 months unless patient becomes symptomatic.

## 2021-01-31 IMAGING — CR DG LUMBAR SPINE COMPLETE 4+V
1 series · 5 of 5 positions shown · non-contrast
Comparison: None.

CLINICAL DATA: 33-year-old male status post fall 6 months ago with
continued back pain low in the midline and on the left.

EXAM:
LUMBAR SPINE - COMPLETE 4+ VIEW

[Series 1: dg lumbar spine complete 4 +v · 0.14mm/px · 5 of 5 slices shown]
[im 1/5]
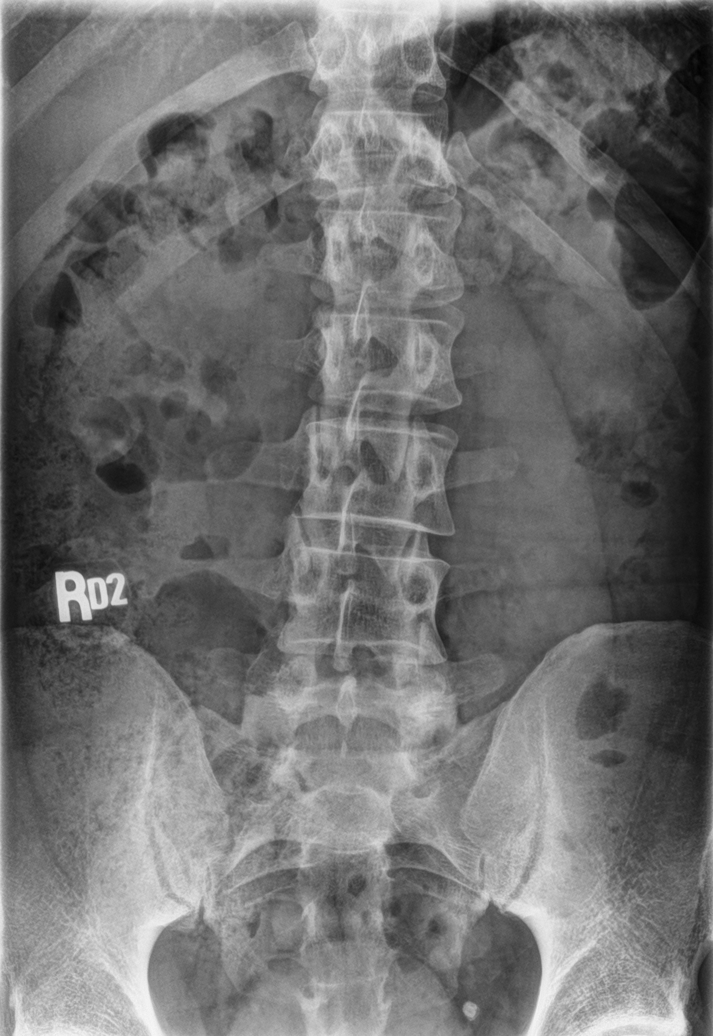
[im 2/5]
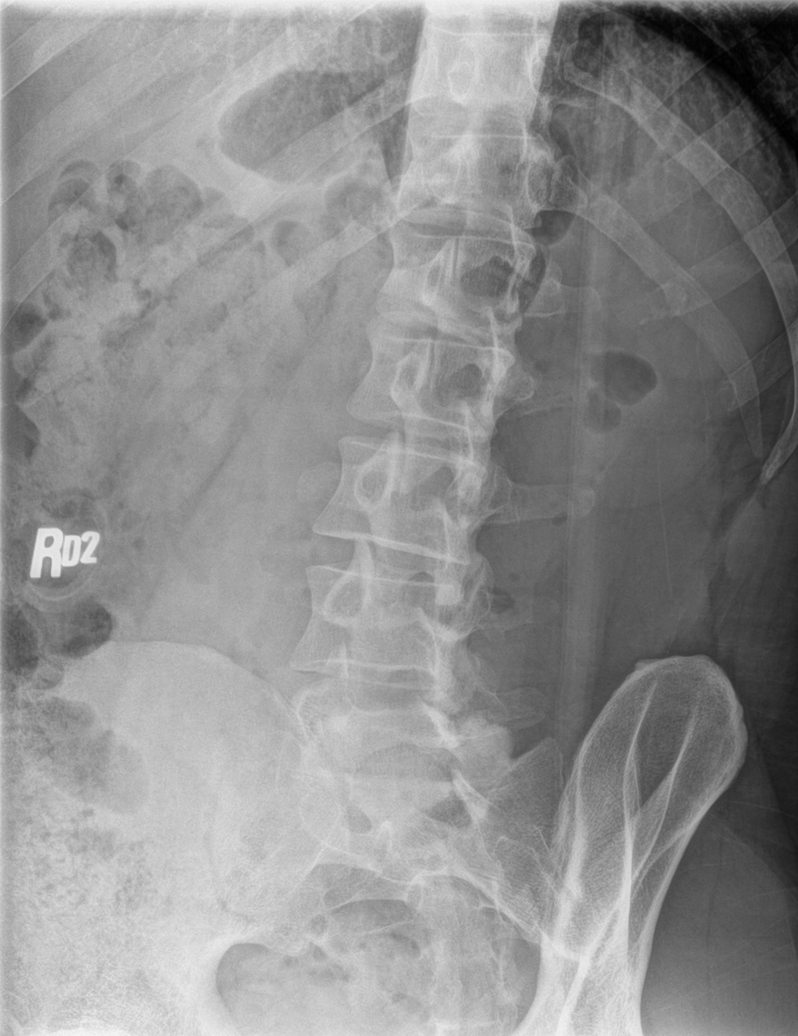
[im 3/5]
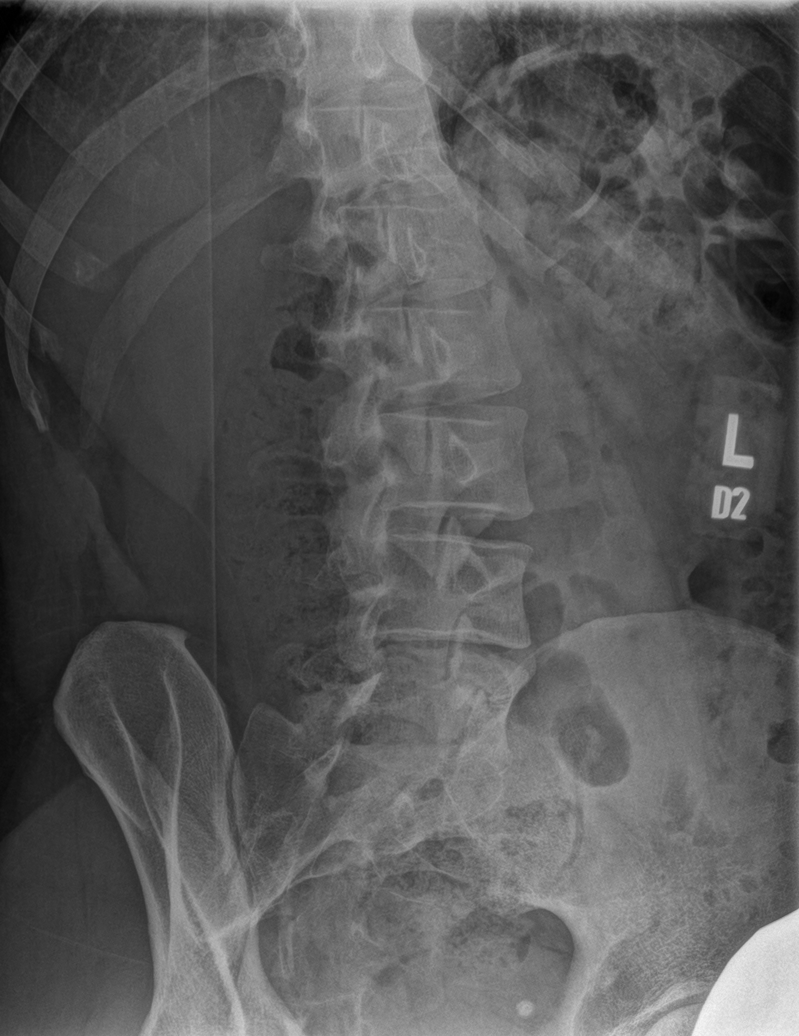
[im 4/5]
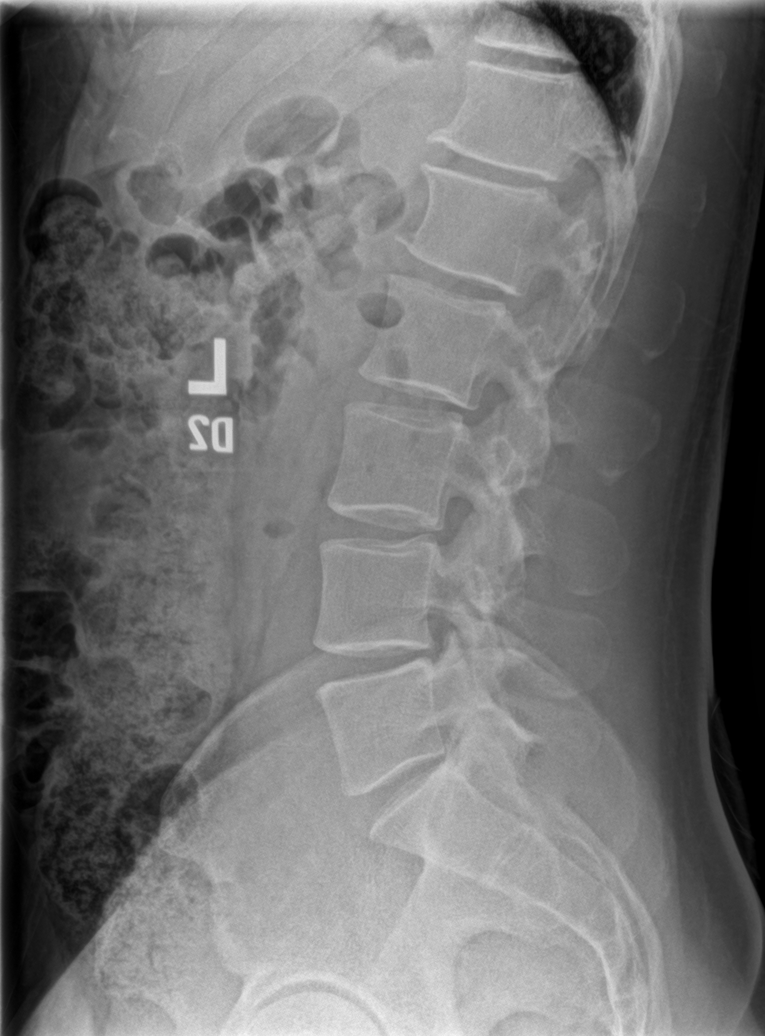
[im 5/5]
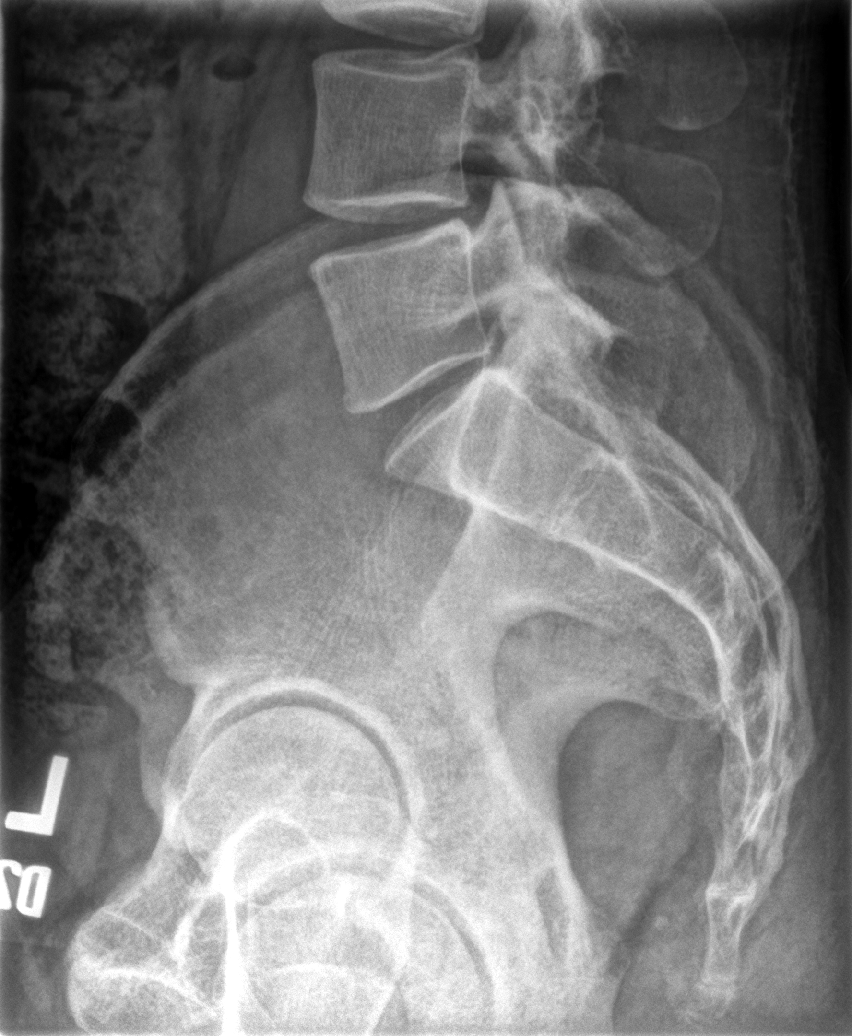

[5 of 5 positions shown; findings below may reference images not displayed]

FINDINGS: Normal lumbar segmentation. Preserved lordosis. Endplate spurring,
most pronounced at T12 and L1, but relatively preserved disc spaces
no pars fracture or spondylolisthesis. Sacral ala and SI joints
appear within normal limits. Visible lower thoracic levels appear
intact. No acute osseous abnormality identified. Negative abdominal
visceral contours. Left hemipelvis phlebolith.
IMPRESSION: No acute osseous abnormality identified. Vertebral endplate spurring
but relatively preserved disc spaces.

## 2021-02-14 DIAGNOSIS — Z3009 Encounter for other general counseling and advice on contraception: Secondary | ICD-10-CM | POA: Diagnosis not present

## 2021-03-12 HISTORY — PX: VASECTOMY: SHX75

## 2021-03-12 HISTORY — PX: SPINE SURGERY: SHX786

## 2021-04-14 DIAGNOSIS — Z302 Encounter for sterilization: Secondary | ICD-10-CM | POA: Diagnosis not present

## 2021-05-06 DIAGNOSIS — J0140 Acute pansinusitis, unspecified: Secondary | ICD-10-CM | POA: Diagnosis not present

## 2021-06-01 ENCOUNTER — Other Ambulatory Visit: Payer: Self-pay | Admitting: Physician Assistant

## 2021-06-01 MED ORDER — ORPHENADRINE CITRATE ER 100 MG PO TB12: 100.0000 mg | ORAL_TABLET | Freq: Two times a day (BID) | ORAL | 0 refills | Status: DC

## 2021-07-18 ENCOUNTER — Ambulatory Visit: Payer: Self-pay

## 2021-07-18 DIAGNOSIS — Z0000 Encounter for general adult medical examination without abnormal findings: Secondary | ICD-10-CM

## 2021-07-18 DIAGNOSIS — M9904 Segmental and somatic dysfunction of sacral region: Secondary | ICD-10-CM | POA: Diagnosis not present

## 2021-07-18 DIAGNOSIS — M7918 Myalgia, other site: Secondary | ICD-10-CM | POA: Diagnosis not present

## 2021-07-18 DIAGNOSIS — M5459 Other low back pain: Secondary | ICD-10-CM | POA: Diagnosis not present

## 2021-07-18 DIAGNOSIS — M9905 Segmental and somatic dysfunction of pelvic region: Secondary | ICD-10-CM | POA: Diagnosis not present

## 2021-07-18 DIAGNOSIS — M9902 Segmental and somatic dysfunction of thoracic region: Secondary | ICD-10-CM | POA: Diagnosis not present

## 2021-07-18 DIAGNOSIS — M9903 Segmental and somatic dysfunction of lumbar region: Secondary | ICD-10-CM | POA: Diagnosis not present

## 2021-07-18 LAB — POCT URINALYSIS DIPSTICK
Bilirubin, UA: NEGATIVE
Blood, UA: NEGATIVE
Glucose, UA: NEGATIVE
Ketones, UA: NEGATIVE
Leukocytes, UA: NEGATIVE
Nitrite, UA: NEGATIVE
Protein, UA: NEGATIVE
Spec Grav, UA: 1.025 (ref 1.010–1.025)
Urobilinogen, UA: 0.2 E.U./dL
pH, UA: 6 (ref 5.0–8.0)

## 2021-07-19 LAB — CMP12+LP+TP+TSH+6AC+CBC/D/PLT
ALT: 19 IU/L (ref 0–44)
AST: 22 IU/L (ref 0–40)
Albumin/Globulin Ratio: 1.8 (ref 1.2–2.2)
Albumin: 4.7 g/dL (ref 4.0–5.0)
Alkaline Phosphatase: 44 IU/L (ref 44–121)
BUN/Creatinine Ratio: 14 (ref 9–20)
BUN: 16 mg/dL (ref 6–20)
Basophils Absolute: 0 10*3/uL (ref 0.0–0.2)
Basos: 1 %
Bilirubin Total: 1.4 mg/dL — ABNORMAL HIGH (ref 0.0–1.2)
Calcium: 9.4 mg/dL (ref 8.7–10.2)
Chloride: 103 mmol/L (ref 96–106)
Chol/HDL Ratio: 3.3 ratio (ref 0.0–5.0)
Cholesterol, Total: 218 mg/dL — ABNORMAL HIGH (ref 100–199)
Creatinine, Ser: 1.16 mg/dL (ref 0.76–1.27)
EOS (ABSOLUTE): 0.2 10*3/uL (ref 0.0–0.4)
Eos: 3 %
Estimated CHD Risk: <0.5 times avg. (ref 0.0–1.0)
Free Thyroxine Index: 2.5 (ref 1.2–4.9)
GGT: 15 IU/L (ref 0–65)
Globulin, Total: 2.6 g/dL (ref 1.5–4.5)
Glucose: 97 mg/dL (ref 70–99)
HDL: 66 mg/dL (ref >39)
Hematocrit: 46 % (ref 37.5–51.0)
Hemoglobin: 15.4 g/dL (ref 13.0–17.7)
Immature Grans (Abs): 0 10*3/uL (ref 0.0–0.1)
Immature Granulocytes: 0 %
Iron: 131 ug/dL (ref 38–169)
LDH: 186 IU/L (ref 121–224)
LDL Chol Calc (NIH): 138 mg/dL — ABNORMAL HIGH (ref 0–99)
Lymphocytes Absolute: 1.9 10*3/uL (ref 0.7–3.1)
Lymphs: 36 %
MCH: 28.3 pg (ref 26.6–33.0)
MCHC: 33.5 g/dL (ref 31.5–35.7)
MCV: 85 fL (ref 79–97)
Monocytes Absolute: 0.4 10*3/uL (ref 0.1–0.9)
Monocytes: 7 %
Neutrophils Absolute: 2.9 10*3/uL (ref 1.4–7.0)
Neutrophils: 53 %
Phosphorus: 3.5 mg/dL (ref 2.8–4.1)
Platelets: 282 10*3/uL (ref 150–450)
Potassium: 4.6 mmol/L (ref 3.5–5.2)
RBC: 5.44 x10E6/uL (ref 4.14–5.80)
RDW: 12.3 % (ref 11.6–15.4)
Sodium: 140 mmol/L (ref 134–144)
T3 Uptake Ratio: 29 % (ref 24–39)
T4, Total: 8.7 ug/dL (ref 4.5–12.0)
TSH: 6.14 u[IU]/mL — ABNORMAL HIGH (ref 0.450–4.500)
Total Protein: 7.3 g/dL (ref 6.0–8.5)
Triglycerides: 82 mg/dL (ref 0–149)
Uric Acid: 6.6 mg/dL (ref 3.8–8.4)
VLDL Cholesterol Cal: 14 mg/dL (ref 5–40)
WBC: 5.4 10*3/uL (ref 3.4–10.8)
eGFR: 83 mL/min/{1.73_m2} (ref >59)

## 2021-07-20 ENCOUNTER — Ambulatory Visit: Payer: Self-pay | Admitting: Physician Assistant

## 2021-07-20 ENCOUNTER — Encounter: Payer: Self-pay | Admitting: Physician Assistant

## 2021-07-20 VITALS — BP 130/87 | HR 85 | Temp 98.0°F | Resp 12 | Ht 75.0 in | Wt 218.0 lb

## 2021-07-20 DIAGNOSIS — R7989 Other specified abnormal findings of blood chemistry: Secondary | ICD-10-CM

## 2021-07-20 DIAGNOSIS — Z0000 Encounter for general adult medical examination without abnormal findings: Secondary | ICD-10-CM

## 2021-07-20 NOTE — Progress Notes (Signed)
? ?[facility] ? ?____________________________________________ ? ? None  ?  (approximate) ? ?I have reviewed the triage vital signs and the nursing notes. ? ? ?HISTORY ? ?Chief Complaint ?Annual Exam ? ? ? ?HPI ?Nathaniel Walker is a 36 y.o. male patient presents annual physical exam.  Patient voiced concern for chronic back pain and fluctuating TSH levels.  Patient that he was diagnosed by orthopedic with scoliosis secondary to previous back injury.  Patient TSH levels have fluctuated from high to normal with 5 medications for 1 year. ?   ? ?  ? ?History reviewed. No pertinent past medical history. ? ?Patient Active Problem List  ? Diagnosis Date Noted  ? Chronic upper back pain 05/25/2019  ? Back pain 05/14/2019  ? Hearing abnormally acute 11/26/2018  ? Bilateral impacted cerumen 11/26/2018  ? Hearing loss of right ear due to cerumen impaction 11/26/2018  ? Shoulder pain 12/15/2015  ? ? ?Past Surgical History:  ?Procedure Laterality Date  ? VASECTOMY  03/2021  ? ? ?Prior to Admission medications   ?Medication Sig Start Date End Date Taking? Authorizing Provider  ?orphenadrine (NORFLEX) 100 MG tablet Take 1 tablet (100 mg total) by mouth 2 (two) times daily. ?Patient not taking: Reported on 07/20/2021 06/01/21   Sable Feil, PA-C  ? ? ?Allergies ?Patient has no known allergies. ? ?Family History  ?Problem Relation Age of Onset  ? Heart disease Father   ? Hypertension Father   ? Heart disease Maternal Grandfather   ? Heart disease Paternal Grandmother   ? Heart disease Paternal Grandfather   ? ? ?Social History ?Social History  ? ?Tobacco Use  ? Smoking status: Never  ? Smokeless tobacco: Never  ?Substance Use Topics  ? Alcohol use: No  ? Drug use: Never  ? ? ?Review of Systems ?Constitutional: No fever/chills ?Eyes: No visual changes. ?ENT: No sore throat. ?Cardiovascular: Denies chest pain. ?Respiratory: Denies shortness of breath. ?Gastrointestinal: No abdominal pain.  No nausea,  no vomiting.  No diarrhea.  No constipation. ?Genitourinary: Negative for dysuria. ?Musculoskeletal: Chronic for back pain. ?Skin: Negative for rash. ?Neurological: Negative for headaches, focal weakness or numbness. ? ?____________________________________________ ? ? ?PHYSICAL EXAM: ? ?VITAL SIGNS: BP is 130/87, pulse 85, respiration 12, temperature is 98, patient 97% O2 sat on room air.  Patient weighs 218 pounds and BMI is 27.25. ?Constitutional: Alert and oriented. Well appearing and in no acute distress. ?Eyes: Conjunctivae are normal. PERRL. EOMI. ?Head: Atraumatic. ?Nose: No congestion/rhinnorhea. ?Mouth/Throat: Mucous membranes are moist.  Oropharynx non-erythematous. ?Neck: No stridor.  No cervical spine tenderness to palpation. ?Hematological/Lymphatic/Immunilogical: No cervical lymphadenopathy. ?Cardiovascular: Normal rate, regular rhythm. Grossly normal heart sounds.  Good peripheral circulation. ?Respiratory: Normal respiratory effort.  No retractions. Lungs CTAB. ?Gastrointestinal: Soft and nontender. No distention. No abdominal bruits. No CVA tenderness. ?Genitourinary: Deferred ?Musculoskeletal: No lower extremity tenderness nor edema.  No joint effusions. ?Neurologic:  Normal speech and language. No gross focal neurologic deficits are appreciated. No gait instability. ?Skin:  Skin is warm, dry and intact. No rash noted. ?Psychiatric: Mood and affect are normal. Speech and behavior are normal. ? ?____________________________________________ ?  ?LABS ?      ?Component Ref Range & Units 2 d ago ?(07/18/21) 1 yr ago ?(07/20/20) 1 yr ago ?(08/26/19)  ?Color, UA  Amber  yellow  Dark Yellow   ?Clarity, UA  Clear  clear  Clear   ?Glucose, UA Negative Negative  Negative  Negative   ?Bilirubin, UA  Negative  negative  Negative   ?Ketones, UA  Negative  negative  Negative   ?Spec Grav, UA 1.010 - 1.025 1.025  1.025  >=1.030 Abnormal    ?Blood, UA  negative  negative  Negative   ?pH, UA 5.0 - 8.0 6.0  6.0  6.0    ?Protein, UA Negative Negative  Negative  Negative   ?Urobilinogen, UA 0.2 or 1.0 E.U./dL 0.2  0.2  0.2   ?Nitrite, UA  Negative  neative  Negative   ?Leukocytes, UA Negative Negative  Negative  Negative   ?Appearance   medium     ?Odor        ?  ? ?  ?  ?  ?  ?  ?  ? ? ?       ?Component Ref Range & Units 2 d ago ?(07/18/21) 1 yr ago ?(07/20/20) 1 yr ago ?(09/30/19) 1 yr ago ?(08/26/19)  ?Glucose 70 - 99 mg/dL 97  98 R   93 R   ?Uric Acid 3.8 - 8.4 mg/dL 6.6  5.5 CM   6.0 CM   ?Comment:            Therapeutic target for gout patients: <6.0  ?BUN 6 - 20 mg/dL '16  19   18   ' ?Creatinine, Ser 0.76 - 1.27 mg/dL 1.16  1.10   1.17   ?eGFR >59 mL/min/1.73 83  89     ?BUN/Creatinine Ratio 9 - '20 14  17   15   ' ?Sodium 134 - 144 mmol/L 140  144   144   ?Potassium 3.5 - 5.2 mmol/L 4.6  4.8   5.1   ?Chloride 96 - 106 mmol/L 103  106   106   ?Calcium 8.7 - 10.2 mg/dL 9.4  9.3   9.3   ?Phosphorus 2.8 - 4.1 mg/dL 3.5  3.1   3.3   ?Total Protein 6.0 - 8.5 g/dL 7.3  7.0   7.1   ?Albumin 4.0 - 5.0 g/dL 4.7  4.6   4.5   ?Globulin, Total 1.5 - 4.5 g/dL 2.6  2.4   2.6   ?Albumin/Globulin Ratio 1.2 - 2.2 1.8  1.9   1.7   ?Bilirubin Total 0.0 - 1.2 mg/dL 1.4 High   1.5 High    1.0   ?Alkaline Phosphatase 44 - 121 IU/L 44  50   47 Low  R   ?LDH 121 - 224 IU/L 186  212   198   ?AST 0 - 40 IU/L '22  27   19   ' ?ALT 0 - 44 IU/L 19  38   20   ?GGT 0 - 65 IU/L '15  18   15   ' ?Iron 38 - 169 ug/dL 131  120   88   ?Cholesterol, Total 100 - 199 mg/dL 218 High   223 High    200 High    ?Triglycerides 0 - 149 mg/dL 82  59   71   ?HDL >39 mg/dL 66  63   57   ?VLDL Cholesterol Cal 5 - 40 mg/dL '14  10   13   ' ?LDL Chol Calc (NIH) 0 - 99 mg/dL 138 High   150 High    130 High    ?Chol/HDL Ratio 0.0 - 5.0 ratio 3.3  3.5 CM   3.5 CM   ?Comment:  T. Chol/HDL Ratio  ?                                            Men  Women  ?                              1/2 Avg.Risk  3.4    3.3  ?                                  Avg.Risk  5.0    4.4   ?                               2X Avg.Risk  9.6    7.1  ?                               3X Avg.Risk 23.4   11.0   ?Estimated CHD Risk 0.0 - 1.0 times avg.  < 0.5  0.6 CM   0.6 CM   ?   ?TSH 0.450 - 4.500 uIU/mL 6.140 High   6.370 High   1.470  7.250 High    ?T4, Total 4.5 - 12.0 ug/dL 8.7  8.6  7.2  9.0   ?T3 Uptake Ratio 24 - 39 % '29  28  29  29   ' ?Free Thyroxine Index 1.2 - 4.9 2.5  2.4  2.1  2.6   ?WBC 3.4 - 10.8 x10E3/uL 5.4  6.1   5.8   ?RBC 4.14 - 5.80 x10E6/uL 5.44  5.76   5.45   ?Hemoglobin 13.0 - 17.7 g/dL 15.4  15.8   15.3   ?Hematocrit 37.5 - 51.0 % 46.0  50.0   47.4   ?MCV 79 - 97 fL 85  87   87   ?MCH 26.6 - 33.0 pg 28.3  27.4   28.1   ?MCHC 31.5 - 35.7 g/dL 33.5  31.6   32.3   ?RDW 11.6 - 15.4 % 12.3  11.9   12.3   ?Platelets 150 - 450 x10E3/uL 282  306   276   ?Neutrophils Not Estab. % 53  55   50   ?Lymphs Not Estab. % 36  33   33   ?Monocytes Not Estab. % '7  8   12   ' ?Eos Not Estab. % '3  3   4   ' ?Basos Not Estab. % '1  1   1   ' ?Neutrophils Absolute 1.4 - 7.0 x10E3/uL 2.9  3.4   2.9   ?Lymphocytes Absolute 0.7 - 3.1 x10E3/uL 1.9  2.0   1.9   ?Monocytes Absolute 0.1 - 0.9 x10E3/uL 0.4  0.5   0.7   ?EOS (ABSOLUTE) 0.0 - 0.4 x10E3/uL 0.2  0.2   0.2   ?Basophils Absolute 0.0 - 0.2 x10E3/uL 0.0  0.0   0.1   ?Immature Granulocytes Not Estab. % 0  0   0   ?Immature Grans        ?  ? ?____________________________________________ ? ? ? ? ?____________________________________________ ? ? ? ? ?____________________________________________ ? ? ?INITIAL IMPRESSION / ASSESSMENT AND PLAN  ?As part of my medical decision  making, I reviewed the following data within the [place]  ? ?   ?Discussed with patient lab results.  Fibroid uterus ? ?Fluctuation of TSH with normal T3-T4 levels follow-up medication.  Advised patient we will consult endocrinology for definitive evaluation.  Discussed elevation of cholesterol at 218.  However patient's triglycerides are 86 and HDL is 66.  Advise diet control  and will follow-up in 6 months. ?  ? ? ? ? ? ?____________________________________________ ? ? ?FINAL CLINICAL IMPRESSION(S) / ED DIAGNOSES ? ?'@[facility]' @ ? ? ?ED Discharge Orders   ? ? None  ? ?  ? ? ? ?Note:

## 2021-07-20 NOTE — Addendum Note (Signed)
Addended by: Gardner Candle on: 07/20/2021 03:18 PM ? ? Modules accepted: Orders ? ?

## 2021-07-21 DIAGNOSIS — M9903 Segmental and somatic dysfunction of lumbar region: Secondary | ICD-10-CM | POA: Diagnosis not present

## 2021-07-21 DIAGNOSIS — M9904 Segmental and somatic dysfunction of sacral region: Secondary | ICD-10-CM | POA: Diagnosis not present

## 2021-07-21 DIAGNOSIS — M5459 Other low back pain: Secondary | ICD-10-CM | POA: Diagnosis not present

## 2021-07-21 DIAGNOSIS — M7918 Myalgia, other site: Secondary | ICD-10-CM | POA: Diagnosis not present

## 2021-07-21 DIAGNOSIS — M9905 Segmental and somatic dysfunction of pelvic region: Secondary | ICD-10-CM | POA: Diagnosis not present

## 2021-07-21 DIAGNOSIS — M9902 Segmental and somatic dysfunction of thoracic region: Secondary | ICD-10-CM | POA: Diagnosis not present

## 2021-07-24 DIAGNOSIS — M9902 Segmental and somatic dysfunction of thoracic region: Secondary | ICD-10-CM | POA: Diagnosis not present

## 2021-07-24 DIAGNOSIS — M9903 Segmental and somatic dysfunction of lumbar region: Secondary | ICD-10-CM | POA: Diagnosis not present

## 2021-07-24 DIAGNOSIS — M5459 Other low back pain: Secondary | ICD-10-CM | POA: Diagnosis not present

## 2021-07-24 DIAGNOSIS — M7918 Myalgia, other site: Secondary | ICD-10-CM | POA: Diagnosis not present

## 2021-07-24 DIAGNOSIS — M9904 Segmental and somatic dysfunction of sacral region: Secondary | ICD-10-CM | POA: Diagnosis not present

## 2021-07-24 DIAGNOSIS — M9905 Segmental and somatic dysfunction of pelvic region: Secondary | ICD-10-CM | POA: Diagnosis not present

## 2021-07-26 DIAGNOSIS — M9904 Segmental and somatic dysfunction of sacral region: Secondary | ICD-10-CM | POA: Diagnosis not present

## 2021-07-26 DIAGNOSIS — M9903 Segmental and somatic dysfunction of lumbar region: Secondary | ICD-10-CM | POA: Diagnosis not present

## 2021-07-26 DIAGNOSIS — M9905 Segmental and somatic dysfunction of pelvic region: Secondary | ICD-10-CM | POA: Diagnosis not present

## 2021-07-26 DIAGNOSIS — M5459 Other low back pain: Secondary | ICD-10-CM | POA: Diagnosis not present

## 2021-07-26 DIAGNOSIS — M9902 Segmental and somatic dysfunction of thoracic region: Secondary | ICD-10-CM | POA: Diagnosis not present

## 2021-07-26 DIAGNOSIS — M7918 Myalgia, other site: Secondary | ICD-10-CM | POA: Diagnosis not present

## 2021-08-15 NOTE — Patient Instructions (Signed)

## 2021-08-16 ENCOUNTER — Encounter: Payer: Self-pay | Admitting: Nurse Practitioner

## 2021-08-16 ENCOUNTER — Ambulatory Visit (INDEPENDENT_AMBULATORY_CARE_PROVIDER_SITE_OTHER): Payer: 59 | Admitting: Nurse Practitioner

## 2021-08-16 VITALS — BP 127/89 | HR 90 | Ht 75.0 in | Wt 229.0 lb

## 2021-08-16 DIAGNOSIS — R7989 Other specified abnormal findings of blood chemistry: Secondary | ICD-10-CM | POA: Diagnosis not present

## 2021-08-16 DIAGNOSIS — E039 Hypothyroidism, unspecified: Secondary | ICD-10-CM

## 2021-08-16 NOTE — Progress Notes (Signed)
Endocrinology Consult Note                                         08/16/2021, 3:29 PM  Subjective:   Subjective    Nathaniel Walker is a 36 y.o.-year-old male patient being seen in consultation for hypothyroidism referred by Pcp, No.   History reviewed. No pertinent past medical history.  Past Surgical History:  Procedure Laterality Date   VASECTOMY  03/2021    Social History   Socioeconomic History   Marital status: Married    Spouse name: Not on file   Number of children: Not on file   Years of education: Not on file   Highest education level: Not on file  Occupational History   Not on file  Tobacco Use   Smoking status: Never   Smokeless tobacco: Never  Substance and Sexual Activity   Alcohol use: No   Drug use: Never   Sexual activity: Not on file  Other Topics Concern   Not on file  Social History Narrative   Not on file   Social Determinants of Health   Financial Resource Strain: Not on file  Food Insecurity: Not on file  Transportation Needs: Not on file  Physical Activity: Not on file  Stress: Not on file  Social Connections: Not on file    Family History  Problem Relation Age of Onset   Heart disease Father    Hypertension Father    Heart disease Maternal Grandfather    Heart disease Paternal Grandmother    Heart disease Paternal Grandfather     Outpatient Encounter Medications as of 08/16/2021  Medication Sig   [DISCONTINUED] orphenadrine (NORFLEX) 100 MG tablet Take 1 tablet (100 mg total) by mouth 2 (two) times daily. (Patient not taking: Reported on 07/20/2021)   No facility-administered encounter medications on file as of 08/16/2021.    ALLERGIES: No Known Allergies VACCINATION STATUS: Immunization History  Administered Date(s) Administered   DTaP 10/24/1983, 01/21/1984, 04/25/1984, 05/26/1985   Hepatitis B 04/08/2014, 05/11/2014, 10/06/2014   IPV 10/24/1983, 04/25/1984,  05/27/1986, 05/31/1987   Influenza,inj,Quad PF,6+ Mos 12/28/2015   Influenza-Unspecified 11/22/2018   MMR 05/31/1987, 10/08/2001   Moderna Sars-Covid-2 Vaccination 03/11/2019, 04/10/2019   Td 10/08/2001   Tdap 03/13/2015     HPI   Nathaniel Walker List is a patient with no pertinent medical history. he was noted to have abnormal TSH first several years ago in 2018 which has been fluctuating since.  He denies any overt symptoms of hypothyroidism.  His labs were done as part of a routine physical.  I reviewed patient's thyroid tests:  Lab Results  Component Value Date   TSH 6.140 (H) 07/18/2021   TSH 6.370 (H) 07/20/2020   TSH 1.470 09/30/2019   TSH 7.250 (H) 08/26/2019    Pt denies feeling nodules in neck, hoarseness, dysphagia/odynophagia, SOB with lying down.  he denies family history of thyroid disorders.  No family history of thyroid cancer.  No history of radiation therapy to head or neck.  No recent use of iodine supplements.  Denies use of Biotin containing supplements.     ROS:  Constitutional: + weight gain-per chart review, no fatigue, no subjective hyperthermia, no subjective hypothermia Eyes: no blurry vision, no xerophthalmia ENT: no sore throat, no nodules palpated in throat, no dysphagia/odynophagia, no hoarseness Cardiovascular: no chest pain, no SOB, no palpitations, no leg swelling Respiratory: no cough, no SOB Gastrointestinal: no nausea/vomiting/diarrhea Musculoskeletal: + back and right leg pain- having surgery soon Skin: no rashes Neurological: + right arm resting tremor-chronic per patient, + numbness/tingling to RLE- sciatica, no dizziness Psychiatric: no depression, no anxiety   Objective:   Objective     BP 127/89   Pulse 90   Ht '6\' 3"'  (1.905 m)   Wt 229 lb (103.9 kg)   BMI 28.62 kg/m  Wt Readings from Last 3 Encounters:  08/16/21 229 lb (103.9 kg)  07/20/21 218 lb (98.9 kg)  07/29/20 215 lb (97.5 kg)    BP Readings from Last 3 Encounters:   08/16/21 127/89  07/20/21 130/87  07/29/20 (!) 131/94     Constitutional:  Body mass index is 28.62 kg/m., not in acute distress, normal state of mind Eyes: PERRLA, EOMI, no exophthalmos ENT: moist mucous membranes, no thyromegaly, no cervical lymphadenopathy Cardiovascular: normal precordial activity, RRR, no murmur/rubs/gallops Respiratory:  adequate breathing efforts, no gross chest deformity, Clear to auscultation bilaterally Gastrointestinal: abdomen soft, non-tender, no distension, bowel sounds present Musculoskeletal: no gross deformities, strength intact in all four extremities Skin: moist, warm, no rashes Neurological: + tremor with to right outstretched hand only, deep tendon reflexes normal in BLE.   CMP ( most recent) CMP     Component Value Date/Time   NA 140 07/18/2021 0931   K 4.6 07/18/2021 0931   CL 103 07/18/2021 0931   GLUCOSE 97 07/18/2021 0931   BUN 16 07/18/2021 0931   CREATININE 1.16 07/18/2021 0931   CALCIUM 9.4 07/18/2021 0931   PROT 7.3 07/18/2021 0931   ALBUMIN 4.7 07/18/2021 0931   AST 22 07/18/2021 0931   ALT 19 07/18/2021 0931   ALKPHOS 44 07/18/2021 0931   BILITOT 1.4 (H) 07/18/2021 0931   GFRNONAA 80 08/26/2019 0830   GFRAA 92 08/26/2019 0830     Diabetic Labs (most recent): No results found for: HGBA1C   Lipid Panel ( most recent) Lipid Panel     Component Value Date/Time   CHOL 218 (H) 07/18/2021 0931   TRIG 82 07/18/2021 0931   HDL 66 07/18/2021 0931   CHOLHDL 3.3 07/18/2021 0931   LDLCALC 138 (H) 07/18/2021 0931   LABVLDL 14 07/18/2021 0931       Lab Results  Component Value Date   TSH 6.140 (H) 07/18/2021   TSH 6.370 (H) 07/20/2020   TSH 1.470 09/30/2019   TSH 7.250 (H) 08/26/2019      Assessment & Plan:   ASSESSMENT / PLAN:  1. Hypothyroidism-subclinical?  - Will check thyroid tests before next visit: TSH, free T4, free T3, and thyroid antibody testing to assess for autoimmune thyroid dysfunction.  Will  call patient with results when they return.  -Due to absence of clinical goiter, no need for thyroid ultrasound.    - Time spent with the patient: 45 minutes, of which >50% was spent in obtaining information about his symptoms, reviewing his previous labs, evaluations, and treatments, counseling him about his hypothyroidism, and developing a plan to confirm the diagnosis and long term treatment as necessary. Please refer to "Patient Self Inventory"  in the Media tab for reviewed elements of pertinent patient history.  Parke Simmers participated in the discussions, expressed understanding, and voiced agreement with the above plans.  All questions were answered to his satisfaction. he is encouraged to contact clinic should he have any questions or concerns prior to his return visit.   FOLLOW UP PLAN:  Return if symptoms worsen or fail to improve, for Thyroid follow up, Previsit labs-labs today.  Rayetta Pigg, [facility] [address] [place], [place] [address] Phone: [phone] Fax: [phone]  08/16/2021, 3:29 PM

## 2021-08-18 LAB — T4, FREE: Free T4: 1.63 ng/dL (ref 0.82–1.77)

## 2021-08-18 LAB — THYROID PEROXIDASE ANTIBODY: Thyroperoxidase Ab SerPl-aCnc: <9 IU/mL (ref 0–34)

## 2021-08-18 LAB — THYROGLOBULIN ANTIBODY: Thyroglobulin Antibody: <1 IU/mL (ref 0.0–0.9)

## 2021-08-18 LAB — TSH: TSH: 3.71 u[IU]/mL (ref 0.450–4.500)

## 2021-08-18 LAB — T3, FREE: T3, Free: 3.2 pg/mL (ref 2.0–4.4)

## 2021-10-12 ENCOUNTER — Other Ambulatory Visit: Payer: Self-pay

## 2021-10-12 ENCOUNTER — Emergency Department (HOSPITAL_COMMUNITY): Payer: Worker's Compensation

## 2021-10-12 ENCOUNTER — Encounter (HOSPITAL_COMMUNITY): Payer: Self-pay | Admitting: Emergency Medicine

## 2021-10-12 ENCOUNTER — Emergency Department (HOSPITAL_COMMUNITY)
Admission: EM | Admit: 2021-10-12 | Discharge: 2021-10-12 | Disposition: A | Discharge status: Home / Self Care | Payer: Worker's Compensation | Attending: Emergency Medicine | Admitting: Emergency Medicine

## 2021-10-12 DIAGNOSIS — R0602 Shortness of breath: Secondary | ICD-10-CM | POA: Diagnosis not present

## 2021-10-12 DIAGNOSIS — I2699 Other pulmonary embolism without acute cor pulmonale: Secondary | ICD-10-CM

## 2021-10-12 DIAGNOSIS — R509 Fever, unspecified: Secondary | ICD-10-CM | POA: Diagnosis not present

## 2021-10-12 DIAGNOSIS — Z20822 Contact with and (suspected) exposure to covid-19: Secondary | ICD-10-CM | POA: Insufficient documentation

## 2021-10-12 DIAGNOSIS — R059 Cough, unspecified: Secondary | ICD-10-CM | POA: Diagnosis not present

## 2021-10-12 LAB — CBC WITH DIFFERENTIAL/PLATELET
Abs Immature Granulocytes: 0.03 10*3/uL (ref 0.00–0.07)
Basophils Absolute: 0 10*3/uL (ref 0.0–0.1)
Basophils Relative: 0 %
Eosinophils Absolute: 0 10*3/uL (ref 0.0–0.5)
Eosinophils Relative: 0 %
HCT: 46.4 % (ref 39.0–52.0)
Hemoglobin: 15 g/dL (ref 13.0–17.0)
Immature Granulocytes: 0 %
Lymphocytes Relative: 13 %
Lymphs Abs: 1.2 10*3/uL (ref 0.7–4.0)
MCH: 27.9 pg (ref 26.0–34.0)
MCHC: 32.3 g/dL (ref 30.0–36.0)
MCV: 86.4 fL (ref 80.0–100.0)
Monocytes Absolute: 0.9 10*3/uL (ref 0.1–1.0)
Monocytes Relative: 10 %
Neutro Abs: 7.3 10*3/uL (ref 1.7–7.7)
Neutrophils Relative %: 77 %
Platelets: 281 10*3/uL (ref 150–400)
RBC: 5.37 MIL/uL (ref 4.22–5.81)
RDW: 12.3 % (ref 11.5–15.5)
WBC: 9.5 10*3/uL (ref 4.0–10.5)
nRBC: 0 % (ref 0.0–0.2)

## 2021-10-12 LAB — TROPONIN I (HIGH SENSITIVITY): Troponin I (High Sensitivity): 3 ng/L (ref <18)

## 2021-10-12 LAB — RESP PANEL BY RT-PCR (FLU A&B, COVID) ARPGX2
Influenza A by PCR: NEGATIVE
Influenza B by PCR: NEGATIVE
SARS Coronavirus 2 by RT PCR: NEGATIVE

## 2021-10-12 LAB — BASIC METABOLIC PANEL
Anion gap: 11 (ref 5–15)
BUN: 15 mg/dL (ref 6–20)
CO2: 24 mmol/L (ref 22–32)
Calcium: 9.3 mg/dL (ref 8.9–10.3)
Chloride: 101 mmol/L (ref 98–111)
Creatinine, Ser: 1.3 mg/dL — ABNORMAL HIGH (ref 0.61–1.24)
GFR, Estimated: >60 mL/min (ref >60)
Glucose, Bld: 123 mg/dL — ABNORMAL HIGH (ref 70–99)
Potassium: 5 mmol/L (ref 3.5–5.1)
Sodium: 136 mmol/L (ref 135–145)

## 2021-10-12 MED ORDER — APIXABAN 5 MG PO TABS: ORAL_TABLET | ORAL | 0 refills | Status: DC

## 2021-10-12 MED ORDER — IOHEXOL 350 MG/ML SOLN
70.0000 mL | Freq: Once | INTRAVENOUS | Status: AC | PRN
  Administered 2021-10-12: 70 mL via INTRAVENOUS

## 2021-10-12 MED ORDER — SODIUM CHLORIDE 0.9 % IV BOLUS
1000.0000 mL | Freq: Once | INTRAVENOUS | Status: AC
  Administered 2021-10-12: 1000 mL via INTRAVENOUS

## 2021-10-12 MED ORDER — APIXABAN 5 MG PO TABS
10.0000 mg | ORAL_TABLET | Freq: Two times a day (BID) | ORAL | Status: DC
  Administered 2021-10-12: 10 mg via ORAL
  Filled 2021-10-12: qty 2

## 2021-10-12 MED ORDER — HEPARIN (PORCINE) 25000 UT/250ML-% IV SOLN: 1550.0000 [IU]/h | INTRAVENOUS | Status: DC

## 2021-10-12 MED ORDER — HEPARIN BOLUS VIA INFUSION
6000.0000 [IU] | Freq: Once | INTRAVENOUS | Status: DC
  Filled 2021-10-12: qty 6000

## 2021-10-12 MED ORDER — APIXABAN 5 MG PO TABS: 5.0000 mg | ORAL_TABLET | Freq: Two times a day (BID) | ORAL | Status: DC

## 2021-10-12 MED ORDER — ACETAMINOPHEN 500 MG PO TABS
1000.0000 mg | ORAL_TABLET | Freq: Once | ORAL | Status: AC
  Administered 2021-10-12: 1000 mg via ORAL
  Filled 2021-10-12: qty 2

## 2021-10-12 MED ORDER — APIXABAN (ELIQUIS) EDUCATION KIT FOR DVT/PE PATIENTS
PACK | Freq: Once | Status: AC
  Filled 2021-10-12: qty 1

## 2021-10-12 MED ORDER — BENZONATATE 100 MG PO CAPS
100.0000 mg | ORAL_CAPSULE | Freq: Once | ORAL | Status: AC
Start: 2021-10-12 — End: 2021-10-12
  Administered 2021-10-12: 100 mg via ORAL
  Filled 2021-10-12: qty 1

## 2021-10-12 NOTE — ED Provider Notes (Signed)
[facility] EMERGENCY DEPARTMENT Provider Note   CSN: [id number] Arrival date & time: 10/12/21  1200     History  Chief Complaint  Patient presents with   Shortness of Breath    Nathaniel Walker is a 36 y.o. male.  The history is provided by the patient and medical records. No language interpreter was used.  Shortness of Breath   36 year old male presenting complaining of shortness of breath.  Patient report for the past month he has had recurrent cough productive with yellow phlegm.  He also endorsed feeling shortness of breath as if he cannot catch a full breath.  Symptoms seems to be worsening nighttime when he lays down.  Symptoms seems to be worsening for the past few days prompting this ER visit.  He also does endorse occasional bouts of hot and cold.  Does not complain of any headache, congestion, sore throat, ear pain, nausea vomiting diarrhea abdominal pain or back pain.  Denies any recent sick contact.  He did report having spinal surgery 2 months ago and has been staying at home and undergoing rehab.  States surgical site does not cause any significant pain.  No prior history of PE or DVT.  Patient reported [facility] earlier today and had a negative flu and COVID test.  He does complain of some pain on the right side of his chest when he coughs.  Home Medications Prior to Admission medications   Not on File      Allergies    Patient has no known allergies.    Review of Systems   Review of Systems  Respiratory:  Positive for shortness of breath.   All other systems reviewed and are negative.   Physical Exam Updated Vital Signs BP 124/82 (BP Location: Right Arm)   Pulse 92   Temp 97.9 F (36.6 C)   Resp 15   SpO2 98%  Physical Exam Vitals and nursing note reviewed.  Constitutional:      General: He is not in acute distress.    Appearance: He is well-developed.  HENT:     Head: Atraumatic.  Eyes:     Conjunctiva/sclera:  Conjunctivae normal.  Cardiovascular:     Rate and Rhythm: Normal rate and regular rhythm.     Pulses: Normal pulses.     Heart sounds: Normal heart sounds.  Pulmonary:     Effort: Pulmonary effort is normal.     Breath sounds: Normal breath sounds.  Abdominal:     Palpations: Abdomen is soft.     Tenderness: There is no abdominal tenderness.  Musculoskeletal:     Cervical back: Neck supple.     Right lower leg: No edema.     Left lower leg: No edema.  Skin:    Findings: No rash.  Neurological:     Mental Status: He is alert.     ED Results / Procedures / Treatments   Labs (all labs ordered are listed, but only abnormal results are displayed) Labs Reviewed  BASIC METABOLIC PANEL - Abnormal; Notable for the following components:      Result Value   Glucose, Bld 123 (*)    Creatinine, Ser 1.30 (*)    All other components within normal limits  RESP PANEL BY RT-PCR (FLU A&B, COVID) ARPGX2  CBC WITH DIFFERENTIAL/PLATELET  BRAIN NATRIURETIC PEPTIDE  TROPONIN I (HIGH SENSITIVITY)  TROPONIN I (HIGH SENSITIVITY)    EKG None  Date: 10/12/2021  Rate: 102  Rhythm: sinus  tachycardia  QRS Axis: normal  Intervals: normal  ST/T Wave abnormalities: normal  Conduction Disutrbances: none  Narrative Interpretation:   Old EKG Reviewed: No significant changes noted    Radiology CT Angio Chest PE W and/or Wo Contrast  Result Date: 10/12/2021 CLINICAL DATA:  Dyspnea EXAM: CT ANGIOGRAPHY CHEST WITH CONTRAST TECHNIQUE: Multidetector CT imaging of the chest was performed using the standard protocol during bolus administration of intravenous contrast. Multiplanar CT image reconstructions and MIPs were obtained to evaluate the vascular anatomy. RADIATION DOSE REDUCTION: This exam was performed according to the departmental dose-optimization program which includes automated exposure control, adjustment of the mA and/or kV according to patient size and/or use of iterative reconstruction  technique. CONTRAST:  16mL OMNIPAQUE IOHEXOL 350 MG/ML SOLN COMPARISON:  None Available. FINDINGS: Cardiovascular: There is adequate opacification the pulmonary arterial tree. There arm numerous intraluminal filling defects identified within the right upper lobar, middle lobar, and numerous segmental pulmonary arteries of the right lower lobe in keeping with acute pulmonary emboli. The embolic burden is moderate. There is no CT evidence of right heart strain. The RV/LV ratio is approximately 0.8. The central pulmonary arteries are of normal caliber. No coronary artery calcification. No pericardial effusion. The thoracic aorta is unremarkable. Mediastinum/Nodes: No enlarged mediastinal, hilar, or axillary lymph nodes. Thyroid gland, trachea, and esophagus demonstrate no significant findings. Lungs/Pleura: Mean 7 mm pulmonary nodule within the right middle lobe, axial image # 80/6, indeterminate. Lungs are otherwise clear. No pneumothorax or pleural effusion. Central airways are widely patent. Upper Abdomen: No acute abnormality. Musculoskeletal: No chest wall abnormality. No acute or significant osseous findings. Review of the MIP images confirms the above findings. IMPRESSION: 1. Acute pulmonary emboli involving the right upper, middle, and lower lobe segmental pulmonary arteries. The embolic burden is moderate. No CT evidence of right heart strain. 2. 7 mm right solid pulmonary nodule. Per Fleischner Society Guidelines, recommend a non-contrast Chest CT at 6-12 months. If patient is high risk for malignancy, recommend an additional non-contrast Chest CT at 18-24 months; if patient is low risk for malignancy a non-contrast Chest CT at 18-24 months is optional. These guidelines do not apply to immunocompromised patients and patients with cancer. Follow up in patients with significant comorbidities as clinically warranted. For lung cancer screening, adhere to Lung-RADS guidelines. Reference: Radiology. 2017;  284(1):228-43. 3. These results were called by telephone at the time of interpretation on 10/12/2021 at 9:58 pm to provider [facility] , who verbally acknowledged these results. Electronically Signed   By: Helyn Numbers M.D.   On: 10/12/2021 21:58   DG Chest 2 View  Result Date: 10/12/2021 CLINICAL DATA:  Cough EXAM: CHEST - 2 VIEW COMPARISON:  None Available. FINDINGS: The heart size and mediastinal contours are within normal limits. Mild, diffuse bilateral interstitial. The visualized skeletal structures are unremarkable. IMPRESSION: Mild, diffuse bilateral interstitial pulmonary opacity, consistent with edema or infection. No focal airspace opacity. Electronically Signed   By: Jearld Lesch M.D.   On: 10/12/2021 13:49    Procedures Procedures    Medications Ordered in ED Medications  acetaminophen (TYLENOL) tablet 1,000 mg (1,000 mg Oral Given 10/12/21 1333)  sodium chloride 0.9 % bolus 1,000 mL (1,000 mLs Intravenous New Bag/Given 10/12/21 2050)  benzonatate (TESSALON) capsule 100 mg (100 mg Oral Given 10/12/21 2050)  iohexol (OMNIPAQUE) 350 MG/ML injection 70 mL (70 mLs Intravenous Contrast Given 10/12/21 2144)    ED Course/ Medical Decision Making/ A&P  Medical Decision Making Amount and/or Complexity of Data Reviewed Labs: ordered. Radiology: ordered.  Risk Prescription drug management.   BP (!) 147/96 (BP Location: Right Arm)   Pulse 89   Temp 99.2 F (37.3 C) (Oral)   Resp 12   SpO2 97%   8:32 PM This is a 36 year old male presenting with progressive worsening shortness of breath and cough for the past month.  Does endorse having some occasional bouts of hot and cold but not any true fever myalgia.  Symptoms seem to be worsened at nighttime.  He also had a microdiscectomy of his lower back done approximate 7 weeks ago and states he is doing well from that standpoint.  On exam patient is overall well-appearing appears to be in no acute discomfort.  Heart  and lung sounds normal.  No peripheral edema or calf tenderness on exam.  He does not appear to be fluid overloaded.  When patient first arrived, he had a documented temperature of 103.3 improved with Tylenol given in the ED.  Labs EKG and imaging was independently viewed interpreted by me and I agree with radiologist capitation.  Labs remarkable for mild AKI with a creatinine of 1.3, will give IV fluid.  Normal WBC, normal troponin, negative COVID and flu test and chest x-ray shows mild diffuse bilateral interstitial pulmonary opacities consistent with edema or infection however no focal airspace opacity were noted.  Given this finding, it is likely that he is having an ongoing lung infection that may benefit from antibiotic.  However since patient had recent surgery and now having shortness of breath, he is at a higher likelihood of developing PE therefore I will obtain chest CT angiogram to rule out PE.  10:00 PM Radiologist notified that patient has evidence of moderate  PEs to right lung involving multiple segments, with no evidence of right heart strain.  No evidence of pneumonia on CT scan.  He does have an incidental pulmonary nodule on the right side that should be evaluated further outpatient.  Patient will be started on heparin, will consult medicine for admission. Pt was made aware of plan and agrees with plan.   10:26 PM Appreciate consultation from [facility] hospitalist, Dr. Allena Katz, who did review patient's medical record and felt at this time patient likely would not necessary need to be admitted to the hospital for IV heparin.  His reason is that patient is hemodynamically stable, this is a provoked PE and patient does not have any evidence of right heart strain.  Patient can benefit from oral anticoagulant and outpatient follow-up.  Patient does not have a primary care doctor.  I will request transition of care to be involved in patient care and help set up with outpatient follow-up. Will give  a dose of Eliquis here, and I will request pharmacy to talk to pt and provide 30 days supply of Eliquis.   BP (!) 147/96 (BP Location: Right Arm)   Pulse 89   Temp 99.2 F (37.3 C) (Oral)   Resp 12   SpO2 97%    This patient presents to the ED for concern of sob, this involves an extensive number of treatment options, and is a complaint that carries with it a high risk of complications and morbidity.  The differential diagnosis includes PE, PNA, PTX, ACS, Pleural effusion, CHF  Co morbidities that complicate the patient evaluation recent surgery Additional history obtained:  Additional history obtained from pt External records from outside source obtained and reviewed including EMR and prior  labs and imaging  Lab Tests:  I Ordered, and personally interpreted labs.  The pertinent results include:  as above  Imaging Studies ordered:  I ordered imaging studies including Chest CTA I independently visualized and interpreted imaging which showed PE I agree with the radiologist interpretation  Cardiac Monitoring:  The patient was maintained on a cardiac monitor.  I personally viewed and interpreted the cardiac monitored which showed an underlying rhythm of: sinus tachycardia  Medicines ordered and prescription drug management:  I ordered medication including heparin  for PE Reevaluation of the patient after these medicines showed that the patient stayed the same I have reviewed the patients home medicines and have made adjustments as needed  Test Considered: as above  Critical Interventions: IV heparin  Consultations Obtained:  I requested consultation with the [facility] Hospitalist, Dr. Allena Katz,  and discussed lab and imaging findings as well as pertinent plan - they recommend: outpt management of PE with Eliquis  Problem List / ED Course: acute PE  Reevaluation:  After the interventions noted above, I reevaluated the patient and found that they have :stayed the  same  Social Determinants of Health: none  Dispostion:  After consideration of the diagnostic results and the patients response to treatment, I feel that the patent would benefit from outpt f/u.         Final Clinical Impression(s) / ED Diagnoses Final diagnoses:  Acute pulmonary embolism without acute cor pulmonale, unspecified pulmonary embolism type [facility])    Rx / DC Orders ED Discharge Orders     None         Fayrene Helper, PA-C 10/12/21 2333    Charlynne Pander, MD 10/14/21 1455

## 2021-10-12 NOTE — ED Triage Notes (Signed)
Pt w/ SHOB with 30 days but in the last 72 hrs it has gotten worse. Complains of chest congestion. Tested negative for the flu and COVID [facility] today. R sided chest pain with cough intermittently. AOX4. Pt does report discectomy 7 wks ago but recovery has been good for the most post. No signs of infection from the site.

## 2021-10-12 NOTE — ED Provider Triage Note (Signed)
Emergency Medicine Provider Triage Evaluation Note  Nathaniel Walker , a 36 y.o. male  was evaluated in triage.  Pt complains of chest pain, shortness of breath, fever.  Patient reports that he had lumbar discectomy about 7 days ago, has been recovering from surgery well.  He reports for the past 30 days he has had intermittent cough, shortness of breath as well as some chest discomfort.  Symptoms come and go but seem to be worse over the past few days.  Patient has not had any fevers until today.  Febrile to 103.3 on arrival but the rest of his vitals are normal.  [facility] and sent here for further evaluation, no other chronic medical conditions or immunocompromise.  Review of Systems  Positive: Chest pain, shortness of breath, cough, fever Negative: Lightheadedness, syncope  Physical Exam  BP 122/88 (BP Location: Right Arm)   Pulse 98   Temp (!) 103.3 F (39.6 C) (Oral)   Resp 16   SpO2 97%  Gen:   Awake, no distress   Resp:  Normal effort, CTA bilateral MSK:   Moves extremities without difficulty  Other:    Medical Decision Making  Medically screening exam initiated at 1:26 PM.  Appropriate orders placed.  Kristof Nadeem was informed that the remainder of the evaluation will be completed by another provider, this initial triage assessment does not replace that evaluation, and the importance of remaining in the ED until their evaluation is complete.  Work-up initiated in triage.  Tylenol given for fever.   Dartha Lodge, [place] 10/12/21 1333

## 2021-10-12 NOTE — Progress Notes (Addendum)
ANTICOAGULATION CONSULT NOTE - Initial Consult  Pharmacy Consult for heparin Indication: pulmonary embolus  No Known Allergies  Patient Measurements: Height: 6\' 3"  (190.5 cm) Weight: 99.8 kg (220 lb) IBW/kg (Calculated) : 84.5  Vital Signs: Temp: 99.2 F (37.3 C) (08/03 2014) Temp Source: Oral (08/03 2014) BP: 147/96 (08/03 2014) Pulse Rate: 89 (08/03 2014)  Labs: Recent Labs    10/12/21 1339  HGB 15.0  HCT 46.4  PLT 281  CREATININE 1.30*  TROPONINIHS 3    Estimated Creatinine Clearance: 92.1 mL/min (A) (by C-G formula based on SCr of 1.3 mg/dL (H)).   Medical History: History reviewed. No pertinent past medical history.   Assessment: 47 YOM presenting with SOB, CP, CT angio shows PE, no RHS.  He is not on anticoagulation PTA, CBC wnl.  Goal of Therapy:  Heparin level 0.3-0.7 units/ml Monitor platelets by anticoagulation protocol: Yes   Plan:  Heparin 6000 units IV x 1, and gtt at 1550 units F/u 6 hour heparin level F/u long term AC plan  Addendum: Transition to Eliquis 10mg  BID x 7d, then 5mg  BID thereafter  20, PharmD Clinical Pharmacist ED Pharmacist Phone # [phone] 10/12/2021 10:13 PM

## 2021-10-12 NOTE — ED Notes (Signed)
Pt verbalized understanding of d/c instructions, meds, and followup care. Denies questions. VSS, no distress noted. Steady gait to exit with all belongings.  ?

## 2021-10-12 NOTE — Discharge Instructions (Addendum)
You have been evaluated for your symptoms.  You have been diagnosed with blood clot in your right lung.  Take Eliquis as prescribed.  You will receive a call tomorrow from our Social Worker to help with follow up with a primary care provider.  You have a nodule in the right lung.  You will need a repeat CT scan in 6-12 months for reassessment.  Return to the ER if your symptoms if your symptoms worsen.   Information on my medicine - ELIQUIS (apixaban)   Why was Eliquis prescribed for you? Eliquis was prescribed to treat blood clots that may have been found in the veins of your legs (deep vein thrombosis) or in your lungs (pulmonary embolism) and to reduce the risk of them occurring again.  What do You need to know about Eliquis ? The starting dose is 10 mg (two 5 mg tablets) taken TWICE daily for the FIRST SEVEN (7) DAYS, then on 8/10 the dose is reduced to ONE 5 mg tablet taken TWICE daily.  Eliquis may be taken with or without food.   Try to take the dose about the same time in the morning and in the evening. If you have difficulty swallowing the tablet whole please discuss with your pharmacist how to take the medication safely.  Take Eliquis exactly as prescribed and DO NOT stop taking Eliquis without talking to the doctor who prescribed the medication.  Stopping may increase your risk of developing a new blood clot.  Refill your prescription before you run out.  After discharge, you should have regular check-up appointments with your healthcare provider that is prescribing your Eliquis.    What do you do if you miss a dose? If a dose of ELIQUIS is not taken at the scheduled time, take it as soon as possible on the same day and twice-daily administration should be resumed. The dose should not be doubled to make up for a missed dose.  Important Safety Information A possible side effect of Eliquis is bleeding. You should call your healthcare provider right away if you experience any  of the following: Bleeding from an injury or your nose that does not stop. Unusual colored urine (red or dark brown) or unusual colored stools (red or black). Unusual bruising for unknown reasons. A serious fall or if you hit your head (even if there is no bleeding).  Some medicines may interact with Eliquis and might increase your risk of bleeding or clotting while on Eliquis. To help avoid this, consult your healthcare provider or pharmacist prior to using any new prescription or non-prescription medications, including herbals, vitamins, non-steroidal anti-inflammatory drugs (NSAIDs) and supplements.  This website has more information on Eliquis (apixaban): http://www.eliquis.com/eliquis/home

## 2021-10-18 ENCOUNTER — Encounter: Payer: Self-pay | Admitting: Internal Medicine

## 2021-10-18 ENCOUNTER — Ambulatory Visit (INDEPENDENT_AMBULATORY_CARE_PROVIDER_SITE_OTHER): Payer: Worker's Compensation | Admitting: Internal Medicine

## 2021-10-18 VITALS — BP 132/78 | HR 87 | Temp 97.9°F | Ht 75.0 in | Wt 221.2 lb

## 2021-10-18 DIAGNOSIS — I2699 Other pulmonary embolism without acute cor pulmonale: Secondary | ICD-10-CM

## 2021-10-18 DIAGNOSIS — R911 Solitary pulmonary nodule: Secondary | ICD-10-CM

## 2021-10-18 MED ORDER — APIXABAN 5 MG PO TABS: ORAL_TABLET | ORAL | 4 refills | Status: DC

## 2021-10-18 MED ORDER — APIXABAN 5 MG PO TABS
ORAL_TABLET | ORAL | 4 refills | Status: DC
Start: 2021-10-18 — End: 2022-07-17

## 2021-10-18 NOTE — Progress Notes (Signed)
Nathaniel Walker    [id number]    Nov 29, 1985  Primary Care Physician:Pcp, No  Referring Physician: No referring provider defined for this encounter. Reason for Consultation: acute pulmonary embolism Date of Consultation: 10/18/2021  Chief complaint:   Chief Complaint  Patient presents with   Consult    CTA-Pulm. Nodule. PE sob 1 mth.Cough- white, yellow. Lung pain right side, fever last week up to 103.     HPI: Nathaniel Walker is a 36 y.o. man who presents for new patient evaluation of shortness of breath and cough in the setting of newly diagnosed PE. He had been having symptoms on and off for about a month prior. Last week wasn't able to walk and talk at the same time due to dyspnea. [facility] for respiratory viral panel. Then went to the ED - had CTPE study which showed right sided pulmonary embolism without right heart strain.   He had surgery on June 16th - Lumbar discectomy L4-L5 (approx 2 hours.) He was still walking multiple miles a day prior to surgery and was walking the day after surgery more than 500 feet/day (by second week of surgery he was walking 2-3 miles/day.)   Denied any leg or thigh swelling or pain prior to this. He was having some residual nerve pain in his right calf but attributed this to his spine disease.   Never had a blood clot before this. No family history.  Has been having sharp shooting pain in his right chest which has improved since last week. Breathing is improving.   He is taking eliquis daily no bleeding issues, no GI issues.   Denies abdominal pain, weight loss, bloody stools, trouble starting or stopping a stream. No melanoma history of unusual moles or rashes. No family history of colon cancer.   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  Exposures: lives at home with wife, child and 2 dogs.  Smoking history: never smoker.   Social History   Occupational History   Not  on file  Tobacco Use   Smoking status: Never   Smokeless tobacco: Never  Substance and Sexual Activity   Alcohol use: No   Drug use: Never   Sexual activity: Not on file    Relevant family history:  Family History  Problem Relation Age of Onset   Heart disease Father    Hypertension Father    Heart disease Maternal Grandfather    Heart disease Paternal Grandmother    Heart disease Paternal Grandfather    Pulmonary embolism Neg Hx     History reviewed. No pertinent past medical history.  Past Surgical History:  Procedure Laterality Date   SPINE SURGERY Bilateral 2023   VASECTOMY  03/2021     Physical Exam: Blood pressure 132/78, pulse 87, temperature 97.9 F (36.6 C), temperature source Temporal, height _0  (1.905 m), weight 221 lb 3.2 oz (100.3 kg), SpO2 97 %. Gen:      No acute distress ENT:  no nasal polyps, mucus membranes moist Lungs:    No increased respiratory effort, symmetric chest wall excursion, clear to auscultation bilaterally, no wheezes or crackles CV:         Regular rate and rhythm; no murmurs, rubs, or gallops.  No pedal edema Abd:      + bowel sounds; soft, non-tender; no distension MSK: no acute synovitis of DIP or PIP joints, no mechanics hands.  Skin:  Warm and dry; no rashes Neuro: normal speech, no focal facial asymmetry Psych: alert and oriented x3, normal mood and affect   Data Reviewed/Medical Decision Making:  Independent interpretation of tests: Imaging:  Review of patient's CTPE  images revealed Right PE, no RV strain. RML pulmonary nodule, well circumscribed. . The patient's images have been independently reviewed by me.    PFTs: I have personally reviewed the patient's PFTs and      No data to display          Labs: Labs 10/12/21 show No elevated tropoinin Hgb 15 Cr 1.3 Resp viral panel negative  Immunization status:  Immunization History  Administered Date(s) Administered   DTaP 10/24/1983, 01/21/1984,  04/25/1984, 05/26/1985   Hepatitis B 04/08/2014, 05/11/2014, 10/06/2014   IPV 10/24/1983, 04/25/1984, 05/27/1986, 05/31/1987   Influenza,inj,Quad PF,6+ Mos 12/28/2015   Influenza-Unspecified 11/22/2018   MMR 05/31/1987, 10/08/2001   Moderna Sars-Covid-2 Vaccination 03/11/2019, 04/10/2019   Td 10/08/2001   Tdap 03/13/2015     I reviewed prior external note(s) from ED  I reviewed the result(s) of the labs and imaging as noted above.   I have ordered    Assessment:  Acute pulmonary embolism Solitary pulmonary nodule - 79m RML, low risk patient.   Plan/Recommendations:  Likely this is provoked in the setting of surgery in June. Onset was very clear 2 weeks after his surgery he had an increase in his basal heart rate that persisted. Recommend 6 months AC. Repeat CT Chest for nodule in 1 year. Will perform hypercoag work up out of abundance of caution given young age and relatively high activity level. Clinically he is gradually improving and has no restrictions on physical therapy - he can resume routine post-op PT without restriction.   We discussed disease management and progression at length today.    Fleischner Society Guidelines 2017 MacMahon H, Naidich DP, Goo JM, et al. Guidelines for management of incidental pulmonary nodules detected on CT Images: From the Fleischner Society. Radiology 2017; [id number] Copyright  2017 Radiological Society of [place]  Evaluation of the incidental solid pulmonary nodule in adults Nodule size (mm) Low (<5%) cancer risk High (>65%) or moderate (5 to 65%) cancer risk  Solitary  <6 No routine follow-up Optional CT at 12 months  6 to 8 CT at 6 to 12 months, then consider CT at 18 to 24 months CT at 6 to 12 months, then CT at 18 to 24 months  >8 CT at 3 months, then at 9 and 24 months FDG PET/CT, biopsy or resection  Multiple (evaluation based on largest nodule)  <6 No routine follow-up Optional CT at 12 months  ?6 CT at 3 to 6 months, then  consider CT at 18 to 24 months CT at 3 to 6 months, then CT at 18 to 24 months   Not applicable to patients age <35 years, in lung cancer screening, with immunosuppression, known pulmonary disease or symptoms or active primary cancer. Chest CT performed without contrast as contiguous 1 mm sections using low dose technique. Growing or FDG-avid nodules should undergo biopsy or resection. Growth is defined as >1.5 mm increase. Nodules unchanged for >2 years are benign.   CT: computed tomography; FDG: 18-fluorodeoxyglucose; PET: positron emission tomography.   Return to Care: Return in about 1 year (around 10/19/2022).  NLenice Llamas MD [facility]: DSpero Geralds MD

## 2021-10-18 NOTE — Patient Instructions (Signed)
Please schedule follow up scheduled with myself in 1 year.  If my schedule is not open yet, we will contact you with a reminder closer to that time. Please call [phone] if you haven't heard from [place] a month before.   Before your next visit I would like you to have:  CT Chest in one year for pulmonary nodule.  Blood work today for hereditary reasons for blood clot.  Continue eliquis for 6 months to the day after your ED visit.

## 2021-10-19 DIAGNOSIS — K649 Unspecified hemorrhoids: Secondary | ICD-10-CM | POA: Diagnosis not present

## 2021-10-19 DIAGNOSIS — I2699 Other pulmonary embolism without acute cor pulmonale: Secondary | ICD-10-CM | POA: Diagnosis not present

## 2021-10-19 DIAGNOSIS — Z7189 Other specified counseling: Secondary | ICD-10-CM | POA: Diagnosis not present

## 2021-10-19 LAB — PROTEIN S, TOTAL AND FREE
Protein S Ag, Free: 117 % (ref 61–136)
Protein S Ag, Total: 129 % (ref 60–150)

## 2021-10-24 LAB — FACTOR 5 LEIDEN: Result: NEGATIVE

## 2021-10-24 LAB — PROTHROMBIN GENE MUTATION: PROTHROMBIN (FACTOR II): NEGATIVE

## 2021-10-24 LAB — ANTITHROMBIN III: AntiThromb III Func: 106 % normal (ref 80–135)

## 2021-11-02 ENCOUNTER — Other Ambulatory Visit (HOSPITAL_COMMUNITY): Payer: 59

## 2021-11-20 ENCOUNTER — Encounter: Payer: Self-pay | Admitting: Physician Assistant

## 2021-11-20 ENCOUNTER — Ambulatory Visit: Payer: Self-pay | Admitting: Physician Assistant

## 2021-11-20 VITALS — BP 141/95 | HR 79 | Resp 14 | Ht 75.0 in | Wt 220.0 lb

## 2021-11-20 DIAGNOSIS — M778 Other enthesopathies, not elsewhere classified: Secondary | ICD-10-CM

## 2021-11-20 DIAGNOSIS — Z9889 Other specified postprocedural states: Secondary | ICD-10-CM

## 2021-11-20 NOTE — Progress Notes (Signed)
Pt Presents today for WC follow up. [employer] put pt on light duty.

## 2021-11-20 NOTE — Progress Notes (Signed)
   Subjective: Left shoulder and back pain    Patient ID: Nathaniel Walker, male    DOB: 05-03-1985, 36 y.o.   MRN: [id number]  HPI Patient here for evaluation for light duty secondary to left shoulder pain and lumbar discectomy at L4 and L5.  Patient is in physical therapy 2 times a week for the next 6 weeks.  Patient has limitations for lifting and bending.  Concentration physical therapy is mostly on his left shoulder which was evaluated as a tendinitis.  Status post lumbar discectomy patient developed a PE and is currently taking Eliquis.   Review of Systems Negative except for above complaints    Objective:   Physical Exam BP is 141/95, pulse 79, respiration 14, and patient 99% O2 sat on room air.  Patient weighs 220 pounds and BMI is 27.50. No obvious deformity of the left shoulder low back.  Surgical scars of the lumbar spine consistent with history of surgery.  Patient demonstrates free and equal range of motion of the left lower extremity.  Strength against resistance is 2/5.  Neurovascular intact.       Assessment & Plan: Left shoulder tendinitis and low back pain.  Patient will continue Eliquis as directed.  Patient to follow-up physical therapy 2 times a week as directed.  Follow-up this clinic 6 weeks.  Sooner if condition worsens.

## 2021-12-01 ENCOUNTER — Telehealth: Payer: Self-pay | Admitting: Pulmonary Disease

## 2021-12-01 NOTE — Telephone Encounter (Signed)
Hey can you explain this message to me?  Are they going to send a fax? And are they wanting patient to be cleared for work part time or physical therapy?  Im confused

## 2021-12-13 DIAGNOSIS — E663 Overweight: Secondary | ICD-10-CM | POA: Diagnosis not present

## 2021-12-13 DIAGNOSIS — D229 Melanocytic nevi, unspecified: Secondary | ICD-10-CM | POA: Diagnosis not present

## 2021-12-13 DIAGNOSIS — D1801 Hemangioma of skin and subcutaneous tissue: Secondary | ICD-10-CM | POA: Diagnosis not present

## 2021-12-13 DIAGNOSIS — L570 Actinic keratosis: Secondary | ICD-10-CM | POA: Diagnosis not present

## 2021-12-13 DIAGNOSIS — L578 Other skin changes due to chronic exposure to nonionizing radiation: Secondary | ICD-10-CM | POA: Diagnosis not present

## 2021-12-14 NOTE — Telephone Encounter (Signed)
Fax was received from [facility] and it was given to Dr Shearon Stalls to fill out. Unsure if form was faxed to clear patient. But form was given to Dr Shearon Stalls last week. Nothing further needed

## 2022-01-29 DIAGNOSIS — R051 Acute cough: Secondary | ICD-10-CM | POA: Diagnosis not present

## 2022-01-29 DIAGNOSIS — J209 Acute bronchitis, unspecified: Secondary | ICD-10-CM | POA: Diagnosis not present

## 2022-01-29 DIAGNOSIS — Z6828 Body mass index (BMI) 28.0-28.9, adult: Secondary | ICD-10-CM | POA: Diagnosis not present

## 2022-03-20 ENCOUNTER — Encounter: Payer: Self-pay | Admitting: Physician Assistant

## 2022-03-20 ENCOUNTER — Ambulatory Visit: Payer: Self-pay | Admitting: Physician Assistant

## 2022-03-20 DIAGNOSIS — Z7689 Persons encountering health services in other specified circumstances: Secondary | ICD-10-CM

## 2022-03-20 NOTE — Progress Notes (Signed)
Pt presents today wc f/u post surgery. Pt states he's doing fine surgeon wrote note for return full duty 03-21-22

## 2022-03-20 NOTE — Progress Notes (Signed)
   Subjective: Return to work clearance    Patient ID: Nathaniel Walker, male    DOB: 20-Sep-1985, 37 y.o.   MRN: [id number]  HPI Patient is here today for return to work clearance.  Patient was cleared by treating surgeon.  Patient had surgical correction secondary to H&P L4-L5 July 2023.Marland Kitchen  Patient developed blood clot secondary to surgery.  Patient has finished rehab and will continue Eliquis for the next 6 months.   Review of Systems Negative except for above complaint    Objective:   Physical Exam  No acute distress. BP 138/82, pulse 80, respiration 14, and patient is 99% O2 sat on room air. Examination deferred.      Assessment & Plan: Return to work clearance.   Trial of full duties.

## 2022-05-18 DIAGNOSIS — H5213 Myopia, bilateral: Secondary | ICD-10-CM | POA: Diagnosis not present

## 2022-07-10 ENCOUNTER — Ambulatory Visit: Payer: Self-pay

## 2022-07-10 DIAGNOSIS — Z0000 Encounter for general adult medical examination without abnormal findings: Secondary | ICD-10-CM

## 2022-07-10 LAB — POCT URINALYSIS DIPSTICK
Bilirubin, UA: NEGATIVE
Blood, UA: NEGATIVE
Glucose, UA: NEGATIVE
Ketones, UA: NEGATIVE
Leukocytes, UA: NEGATIVE
Nitrite, UA: NEGATIVE
Protein, UA: NEGATIVE
Spec Grav, UA: 1.02 (ref 1.010–1.025)
Urobilinogen, UA: 0.2 E.U./dL
pH, UA: 6 (ref 5.0–8.0)

## 2022-07-10 NOTE — Progress Notes (Signed)
Pt completed labs for physical. ?

## 2022-07-11 LAB — CMP12+LP+TP+TSH+6AC+CBC/D/PLT
ALT: 14 IU/L (ref 0–44)
AST: 19 IU/L (ref 0–40)
Albumin/Globulin Ratio: 1.8 (ref 1.2–2.2)
Albumin: 4.4 g/dL (ref 4.1–5.1)
Alkaline Phosphatase: 50 IU/L (ref 44–121)
BUN/Creatinine Ratio: 14 (ref 9–20)
BUN: 15 mg/dL (ref 6–20)
Basophils Absolute: 0 10*3/uL (ref 0.0–0.2)
Basos: 1 %
Bilirubin Total: 1 mg/dL (ref 0.0–1.2)
Calcium: 9.1 mg/dL (ref 8.7–10.2)
Chloride: 104 mmol/L (ref 96–106)
Chol/HDL Ratio: 3.5 ratio (ref 0.0–5.0)
Cholesterol, Total: 211 mg/dL — ABNORMAL HIGH (ref 100–199)
Creatinine, Ser: 1.07 mg/dL (ref 0.76–1.27)
EOS (ABSOLUTE): 0.2 10*3/uL (ref 0.0–0.4)
Eos: 3 %
Estimated CHD Risk: 0.5 times avg. (ref 0.0–1.0)
Free Thyroxine Index: 2.9 (ref 1.2–4.9)
GGT: 12 IU/L (ref 0–65)
Globulin, Total: 2.5 g/dL (ref 1.5–4.5)
Glucose: 100 mg/dL — ABNORMAL HIGH (ref 70–99)
HDL: 61 mg/dL (ref >39)
Hematocrit: 45.7 % (ref 37.5–51.0)
Hemoglobin: 14.9 g/dL (ref 13.0–17.7)
Immature Grans (Abs): 0 10*3/uL (ref 0.0–0.1)
Immature Granulocytes: 0 %
Iron: 101 ug/dL (ref 38–169)
LDH: 180 IU/L (ref 121–224)
LDL Chol Calc (NIH): 138 mg/dL — ABNORMAL HIGH (ref 0–99)
Lymphocytes Absolute: 2.4 10*3/uL (ref 0.7–3.1)
Lymphs: 30 %
MCH: 27.7 pg (ref 26.6–33.0)
MCHC: 32.6 g/dL (ref 31.5–35.7)
MCV: 85 fL (ref 79–97)
Monocytes Absolute: 0.6 10*3/uL (ref 0.1–0.9)
Monocytes: 7 %
Neutrophils Absolute: 4.6 10*3/uL (ref 1.4–7.0)
Neutrophils: 59 %
Phosphorus: 3.5 mg/dL (ref 2.8–4.1)
Platelets: 372 10*3/uL (ref 150–450)
Potassium: 4.4 mmol/L (ref 3.5–5.2)
RBC: 5.38 x10E6/uL (ref 4.14–5.80)
RDW: 12.2 % (ref 11.6–15.4)
Sodium: 141 mmol/L (ref 134–144)
T3 Uptake Ratio: 31 % (ref 24–39)
T4, Total: 9.4 ug/dL (ref 4.5–12.0)
TSH: 4.61 u[IU]/mL — ABNORMAL HIGH (ref 0.450–4.500)
Total Protein: 6.9 g/dL (ref 6.0–8.5)
Triglycerides: 66 mg/dL (ref 0–149)
Uric Acid: 6.1 mg/dL (ref 3.8–8.4)
VLDL Cholesterol Cal: 12 mg/dL (ref 5–40)
WBC: 7.8 10*3/uL (ref 3.4–10.8)
eGFR: 91 mL/min/{1.73_m2} (ref >59)

## 2022-07-17 ENCOUNTER — Ambulatory Visit: Payer: Self-pay | Admitting: Physician Assistant

## 2022-07-17 ENCOUNTER — Encounter: Payer: Self-pay | Admitting: Physician Assistant

## 2022-07-17 VITALS — BP 127/84 | HR 78 | Temp 98.2°F | Resp 14 | Ht 75.0 in | Wt 218.0 lb

## 2022-07-17 DIAGNOSIS — Z0000 Encounter for general adult medical examination without abnormal findings: Secondary | ICD-10-CM

## 2022-07-17 NOTE — Progress Notes (Signed)
[facility]  ____________________________________________   None    (approximate)  I have reviewed the triage vital signs and the nursing notes.   HISTORY  Chief Complaint No chief complaint on file.  HPI Nathaniel Walker is a 37 y.o. male patient presents for annual physical exam.  Patient reports no concerns or complaints.  Patient has a 4-year history of intermittent elevated TSH.  Patient has been evaluated by endocrinologist and was told no other markers and he is asymptomatic.  Patient has discontinued Eliquis upon advice of pulmonologist secondary to pulmonary nodule and dyspnea that was found last year and has resolved.       No past medical history on file.  Patient Active Problem List   Diagnosis Date Noted   Chronic upper back pain 05/25/2019   Back pain 05/14/2019   Hearing abnormally acute 11/26/2018   Bilateral impacted cerumen 11/26/2018   Hearing loss of right ear due to cerumen impaction 11/26/2018   Shoulder pain 12/15/2015    Past Surgical History:  Procedure Laterality Date   SPINE SURGERY Bilateral 2023   VASECTOMY  03/2021    Prior to Admission medications   Medication Sig Start Date End Date Taking? Authorizing Provider  apixaban (ELIQUIS) 5 MG TABS tablet Take 2 tablets (10mg ) twice daily for 7 days, then 1 tablet (5mg ) twice daily 10/18/21   Charlott Holler, MD    Allergies Patient has no known allergies.  Family History  Problem Relation Age of Onset   Heart disease Father    Hypertension Father    Heart disease Maternal Grandfather    Heart disease Paternal Grandmother    Heart disease Paternal Grandfather    Pulmonary embolism Neg Hx     Social History Social History   Tobacco Use   Smoking status: Never   Smokeless tobacco: Never  Substance Use Topics   Alcohol use: No   Drug use: Never    Review of Systems Constitutional: No fever/chills Eyes: No visual changes. ENT: No sore  throat. Cardiovascular: Denies chest pain. Respiratory: Denies shortness of breath. Gastrointestinal: No abdominal pain.  No nausea, no vomiting.  No diarrhea.  No constipation. Genitourinary: Negative for dysuria. Musculoskeletal: Negative for back pain. Skin: Negative for rash. Neurological: Negative for headaches, focal weakness or numbness.  ____________________________________________   PHYSICAL EXAM:  VITAL SIGNS: BP 127/84  Pulse 78  Resp 14  Temp 98.2 F (36.8 C)  Temp src Temporal  SpO2 96 %  Weight 218 lb (98.9 kg)  Height 6\' 3"  (1.905 m)   BMI 27.25 kg/m2  BSA 2.29 m2  Constitutional: Alert and oriented. Well appearing and in no acute distress. Eyes: Conjunctivae are normal. PERRL. EOMI. Head: Atraumatic. Nose: No congestion/rhinnorhea. Mouth/Throat: Mucous membranes are moist.  Oropharynx non-erythematous. Neck: No stridor.  No cervical spine tenderness to palpation. Hematological/Lymphatic/Immunilogical: No cervical lymphadenopathy. Cardiovascular: Normal rate, regular rhythm. Grossly normal heart sounds.  Good peripheral circulation. Respiratory: Normal respiratory effort.  No retractions. Lungs CTAB. Gastrointestinal: Soft and nontender. No distention. No abdominal bruits. No CVA tenderness. Genitourinary: Deferred Musculoskeletal: No lower extremity tenderness nor edema.  No joint effusions. Neurologic:  Normal speech and language. No gross focal neurologic deficits are appreciated. No gait instability. Skin:  Skin is warm, dry and intact. No rash noted. Psychiatric: Mood and affect are normal. Speech and behavior are normal.  ____________________________________________   LABS        Component Ref Range & Units 7 d ago 12  mo ago 1 yr ago 2 yr ago  Color, UA yellow Amber yellow Dark Yellow  Clarity, UA clear Clear clear Clear  Glucose, UA Negative Negative Negative Negative Negative  Bilirubin, UA neg Negative negative Negative  Ketones, UA neg  Negative negative Negative  Spec Grav, UA 1.010 - 1.025 1.020 1.025 1.025 >=1.030 Abnormal   Blood, UA neg negative negative Negative  pH, UA 5.0 - 8.0 6.0 6.0 6.0 6.0  Protein, UA Negative Negative Negative Negative Negative  Urobilinogen, UA 0.2 or 1.0 E.U./dL 0.2 0.2 0.2 0.2  Nitrite, UA neg Negative neative Negative  Leukocytes, UA Negative Negative Negative Negative Negative  Appearance   medium   Odor                     Component Ref Range & Units 7 d ago (07/10/22) 9 mo ago (10/12/21) 9 mo ago (10/12/21) 11 mo ago (08/16/21) 12 mo ago (07/18/21) 1 yr ago (07/20/20) 2 yr ago (09/30/19)  Glucose 70 - 99 mg/dL 161 High  096 High  CM   97 98 R   Uric Acid 3.8 - 8.4 mg/dL 6.1    6.6 CM 5.5 CM   Comment:            Therapeutic target for gout patients: <6.0  BUN 6 - 20 mg/dL 15 15   16 19    Creatinine, Ser 0.76 - 1.27 mg/dL 0.45 4.09 High  R   8.11 1.10   eGFR >59 mL/min/1.73 91    83 89   BUN/Creatinine Ratio 9 - 20 14    14 17    Sodium 134 - 144 mmol/L 141 136 R   140 144   Potassium 3.5 - 5.2 mmol/L 4.4 5.0 R   4.6 4.8   Chloride 96 - 106 mmol/L 104 101 R   103 106   Calcium 8.7 - 10.2 mg/dL 9.1 9.3 R   9.4 9.3   Phosphorus 2.8 - 4.1 mg/dL 3.5    3.5 3.1   Total Protein 6.0 - 8.5 g/dL 6.9    7.3 7.0   Albumin 4.1 - 5.1 g/dL 4.4    4.7 R 4.6 R   Globulin, Total 1.5 - 4.5 g/dL 2.5    2.6 2.4   Albumin/Globulin Ratio 1.2 - 2.2 1.8    1.8 1.9   Bilirubin Total 0.0 - 1.2 mg/dL 1.0    1.4 High  1.5 High    Alkaline Phosphatase 44 - 121 IU/L 50    44 50   LDH 121 - 224 IU/L 180    186 212   AST 0 - 40 IU/L 19    22 27    ALT 0 - 44 IU/L 14    19 38   GGT 0 - 65 IU/L 12    15 18    Iron 38 - 169 ug/dL 914    782 956   Cholesterol, Total 100 - 199 mg/dL 213 High     086 High  223 High    Triglycerides 0 - 149 mg/dL 66    82 59   HDL >57 mg/dL 61    66 63   VLDL Cholesterol Cal 5 - 40 mg/dL 12    14 10    LDL Chol Calc (NIH) 0 - 99 mg/dL 846 High      962 High  150 High    Chol/HDL Ratio 0.0 - 5.0 ratio 3.5    3.3 CM 3.5 CM  Comment:                                   T. Chol/HDL Ratio                                             Men  Women                               1/2 Avg.Risk  3.4    3.3                                   Avg.Risk  5.0    4.4                                2X Avg.Risk  9.6    7.1                                3X Avg.Risk 23.4   11.0  Estimated CHD Risk 0.0 - 1.0 times avg. 0.5     < 0.5 CM 0.6 CM   Comment: The CHD Risk is based on the T. Chol/HDL ratio. Other factors affect CHD Risk such as hypertension, smoking, diabetes, severe obesity, and family history of premature CHD.  TSH 0.450 - 4.500 uIU/mL 4.610 High    3.710 6.140 High  6.370 High  1.470  T4, Total 4.5 - 12.0 ug/dL 9.4    8.7 8.6 7.2  T3 Uptake Ratio 24 - 39 % 31    29 28 29   Free Thyroxine Index 1.2 - 4.9 2.9    2.5 2.4 2.1  WBC 3.4 - 10.8 x10E3/uL 7.8  9.5 R  5.4 6.1   RBC 4.14 - 5.80 x10E6/uL 5.38  5.37 R  5.44 5.76   Hemoglobin 13.0 - 17.7 g/dL 09.8  11.9 R  14.7 82.9   Hematocrit 37.5 - 51.0 % 45.7  46.4 R  46.0 50.0   MCV 79 - 97 fL 85  86.4 R  85 87   MCH 26.6 - 33.0 pg 27.7  27.9 R  28.3 27.4   MCHC 31.5 - 35.7 g/dL 56.2  13.0 R  86.5 78.4   RDW 11.6 - 15.4 % 12.2  12.3 R  12.3 11.9   Platelets 150 - 450 x10E3/uL 372  281 R  282 306   Neutrophils Not Estab. % 59  77 R  53 55   Lymphs Not Estab. % 30    36 33   Monocytes Not Estab. % 7    7 8    Eos Not Estab. % 3    3 3    Basos Not Estab. % 1    1 1    Neutrophils Absolute 1.4 - 7.0 x10E3/uL 4.6  7.3 R  2.9 3.4   Lymphocytes Absolute 0.7 - 3.1 x10E3/uL 2.4  1.2 R  1.9 2.0   Monocytes Absolute 0.1 - 0.9 x10E3/uL 0.6    0.4 0.5   EOS (ABSOLUTE) 0.0 - 0.4 x10E3/uL 0.2    0.2 0.2   Basophils Absolute  0.0 - 0.2 x10E3/uL 0.0  0.0 R  0.0 0.0   Immature Granulocytes Not Estab. % 0  0 R  0 0   Immature Grans (Abs)              _______________________________________     ____________________________________________    ____________________________________________   INITIAL IMPRESSION / ASSESSMENT AND PLAN  As part of my medical decision making, I reviewed the following data within the electronic MEDICAL RECORD NUMBER       No acute findings on physical exam.     ____________________________________________   FINAL CLINICAL IMPRESSION Well exam   ED Discharge Orders     None        Note:  This document was prepared using Dragon voice recognition software and may include unintentional dictation errors.

## 2022-10-10 ENCOUNTER — Ambulatory Visit: Payer: 59 | Admitting: Internal Medicine

## 2022-10-15 ENCOUNTER — Ambulatory Visit (HOSPITAL_COMMUNITY)
Admission: RE | Admit: 2022-10-15 | Discharge: 2022-10-15 | Disposition: A | Discharge status: Home / Self Care | Payer: 59 | Source: Ambulatory Visit | Attending: Internal Medicine | Admitting: Internal Medicine

## 2022-10-15 DIAGNOSIS — R911 Solitary pulmonary nodule: Secondary | ICD-10-CM | POA: Insufficient documentation

## 2022-10-15 DIAGNOSIS — R918 Other nonspecific abnormal finding of lung field: Secondary | ICD-10-CM | POA: Diagnosis not present

## 2022-10-23 ENCOUNTER — Ambulatory Visit: Payer: 59 | Admitting: Internal Medicine

## 2022-12-13 ENCOUNTER — Ambulatory Visit: Payer: 59 | Admitting: Internal Medicine

## 2022-12-17 DIAGNOSIS — L570 Actinic keratosis: Secondary | ICD-10-CM | POA: Diagnosis not present

## 2022-12-17 DIAGNOSIS — L905 Scar conditions and fibrosis of skin: Secondary | ICD-10-CM | POA: Diagnosis not present

## 2022-12-17 DIAGNOSIS — D225 Melanocytic nevi of trunk: Secondary | ICD-10-CM | POA: Diagnosis not present

## 2022-12-17 DIAGNOSIS — L578 Other skin changes due to chronic exposure to nonionizing radiation: Secondary | ICD-10-CM | POA: Diagnosis not present

## 2022-12-17 DIAGNOSIS — D235 Other benign neoplasm of skin of trunk: Secondary | ICD-10-CM | POA: Diagnosis not present

## 2022-12-17 DIAGNOSIS — W908XXS Exposure to other nonionizing radiation, sequela: Secondary | ICD-10-CM | POA: Diagnosis not present

## 2022-12-17 DIAGNOSIS — D485 Neoplasm of uncertain behavior of skin: Secondary | ICD-10-CM | POA: Diagnosis not present

## 2023-01-01 ENCOUNTER — Other Ambulatory Visit: Payer: Self-pay

## 2023-01-01 NOTE — Progress Notes (Signed)
Pt completed random UDS & ETOH. UDS pending with [facility]  ETOH cleared.

## 2023-02-13 ENCOUNTER — Encounter: Payer: Self-pay | Admitting: Internal Medicine

## 2023-02-13 ENCOUNTER — Ambulatory Visit: Payer: Worker's Compensation | Admitting: Internal Medicine

## 2023-02-13 VITALS — BP 140/86 | HR 67 | Ht 75.0 in | Wt 229.0 lb

## 2023-02-13 DIAGNOSIS — R911 Solitary pulmonary nodule: Secondary | ICD-10-CM | POA: Diagnosis not present

## 2023-02-13 DIAGNOSIS — I2699 Other pulmonary embolism without acute cor pulmonale: Secondary | ICD-10-CM | POA: Diagnosis not present

## 2023-02-13 NOTE — Progress Notes (Signed)
Nathaniel Walker    [id number]    [birth year]  Primary Care Physician:[facility] Date of Appointment: 02/13/2023 Established Patient Visit  Chief complaint:   Chief Complaint  Patient presents with   Follow-up    6 month follow up      HPI: Nathaniel Walker is a 37 y.o. man who presents for follow up for SPN and provoked pulmonary embolism.   Interval Updates: Here for follow up. Completed 6 months anticoagulation. He still gets a little worried when he gets short of breath, but nothing like when he had his PE. Has had repeat CT scan to evaluate 7mm SPN in the RML. This is stable and felt to be a benign lymph node.   I have reviewed the patient's family social and past medical history and updated as appropriate.   History reviewed. No pertinent past medical history.  Past Surgical History:  Procedure Laterality Date   SPINE SURGERY Bilateral 2023   VASECTOMY  03/2021    Family History  Problem Relation Age of Onset   Heart disease Father    Hypertension Father    Heart disease Maternal Grandfather    Heart disease Paternal Grandmother    Heart disease Paternal Grandfather    Pulmonary embolism Neg Hx     Social History   Occupational History   Not on file  Tobacco Use   Smoking status: Never   Smokeless tobacco: Never  Substance and Sexual Activity   Alcohol use: No   Drug use: Never   Sexual activity: Not on file     Physical Exam: Blood pressure (!) 140/86, pulse 67, height 6\' 3"  (1.905 m), weight 229 lb (103.9 kg), SpO2 97%.  Gen:      No acute distress ENT:  no nasal polyps, mucus membranes moist Lungs:    No increased respiratory effort, symmetric chest wall excursion, clear to auscultation bilaterally, no wheezes or crackles CV:         Regular rate and rhythm; no murmurs, rubs, or gallops.  No pedal edema   Data Reviewed: Imaging: I have personally reviewed the CT Chest from August 2024 - stable 7 mm pulmonary nodule  likely subpleural lymph node. Otherwise no pulmonary process.   PFTs:   Labs: Lab Results  Component Value Date   NA 141 07/10/2022   K 4.4 07/10/2022   CO2 24 10/12/2021   GLUCOSE 100 (H) 07/10/2022   BUN 15 07/10/2022   CREATININE 1.07 07/10/2022   CALCIUM 9.1 07/10/2022   EGFR 91 07/10/2022   GFRNONAA >60 10/12/2021   Lab Results  Component Value Date   WBC 7.8 07/10/2022   HGB 14.9 07/10/2022   HCT 45.7 07/10/2022   MCV 85 07/10/2022   PLT 372 07/10/2022     Immunization status: Immunization History  Administered Date(s) Administered   DTaP 10/24/1983, 01/21/1984, 04/25/1984, 05/26/1985   Hepatitis B 04/08/2014, 05/11/2014, 10/06/2014   IPV 10/24/1983, 04/25/1984, 05/27/1986, 05/31/1987   Influenza,inj,Quad PF,6+ Mos 12/28/2015   Influenza-Unspecified 11/22/2018   MMR 05/31/1987, 10/08/2001   Moderna Sars-Covid-2 Vaccination 03/11/2019, 04/10/2019   Td 10/08/2001   Tdap 03/13/2015    External Records Personally Reviewed:   Assessment:  Acute pulmonary embolism, provoked SPN  Plan/Recommendations: Has completed duration of anticoagulation for provoked PE in the setting of surgery.  Hypercoaguable work up reviewed and negative.   The small nodule in your lung has not changed and is likely a benign lymph node.  I would recommend talking to your surgeon about blood thinner prophylaxis before and after any major surgeries, especially in the back and lower extremities.   Return to Care: Return if symptoms worsen or fail to improve.   Durel Salts, MD Pulmonary and Critical Care Medicine [facility] Office:[phone]

## 2023-02-13 NOTE — Patient Instructions (Signed)
Please follow up with me as needed.   The small nodule in your lung has not changed and is likely a benign lymph node.   I would recommend talking to your surgeon about blood thinner prophylaxis before and after any major surgeries, especially in the back and lower extremities.

## 2023-03-09 DIAGNOSIS — J101 Influenza due to other identified influenza virus with other respiratory manifestations: Secondary | ICD-10-CM | POA: Diagnosis not present

## 2023-03-09 DIAGNOSIS — Z20822 Contact with and (suspected) exposure to covid-19: Secondary | ICD-10-CM | POA: Diagnosis not present

## 2023-03-09 DIAGNOSIS — R051 Acute cough: Secondary | ICD-10-CM | POA: Diagnosis not present

## 2023-03-09 DIAGNOSIS — B349 Viral infection, unspecified: Secondary | ICD-10-CM | POA: Diagnosis not present

## 2023-03-10 ENCOUNTER — Ambulatory Visit: Payer: 59

## 2023-06-03 ENCOUNTER — Ambulatory Visit: Payer: Self-pay

## 2023-06-03 DIAGNOSIS — Z0000 Encounter for general adult medical examination without abnormal findings: Secondary | ICD-10-CM

## 2023-06-03 LAB — POCT URINALYSIS DIPSTICK
Bilirubin, UA: NEGATIVE
Blood, UA: NEGATIVE
Glucose, UA: NEGATIVE
Leukocytes, UA: NEGATIVE
Nitrite, UA: NEGATIVE
Protein, UA: NEGATIVE
Spec Grav, UA: 1.015 (ref 1.010–1.025)
Urobilinogen, UA: 0.2 U/dL
pH, UA: 6 (ref 5.0–8.0)

## 2023-06-03 NOTE — Progress Notes (Signed)
 Fasting labs, urine and EKG completed prior to COB physical for COB-PD.

## 2023-06-04 LAB — CMP12+LP+TP+TSH+6AC+CBC/D/PLT
ALT: 20 IU/L (ref 0–44)
AST: 29 IU/L (ref 0–40)
Albumin: 4.9 g/dL (ref 4.1–5.1)
Alkaline Phosphatase: 54 IU/L (ref 44–121)
BUN/Creatinine Ratio: 15 (ref 9–20)
BUN: 19 mg/dL (ref 6–20)
Basophils Absolute: 0.1 10*3/uL (ref 0.0–0.2)
Basos: 1 %
Bilirubin Total: 2.4 mg/dL — ABNORMAL HIGH (ref 0.0–1.2)
Calcium: 9.7 mg/dL (ref 8.7–10.2)
Chloride: 103 mmol/L (ref 96–106)
Chol/HDL Ratio: 3.3 ratio (ref 0.0–5.0)
Cholesterol, Total: 245 mg/dL — ABNORMAL HIGH (ref 100–199)
Creatinine, Ser: 1.3 mg/dL — ABNORMAL HIGH (ref 0.76–1.27)
EOS (ABSOLUTE): 0.1 10*3/uL (ref 0.0–0.4)
Eos: 2 %
Estimated CHD Risk: <0.5 times avg. (ref 0.0–1.0)
Free Thyroxine Index: 2.7 (ref 1.2–4.9)
GGT: 11 IU/L (ref 0–65)
Globulin, Total: 2.4 g/dL (ref 1.5–4.5)
Glucose: 94 mg/dL (ref 70–99)
HDL: 74 mg/dL (ref >39)
Hematocrit: 46.9 % (ref 37.5–51.0)
Hemoglobin: 15.6 g/dL (ref 13.0–17.7)
Immature Grans (Abs): 0 10*3/uL (ref 0.0–0.1)
Immature Granulocytes: 0 %
Iron: 130 ug/dL (ref 38–169)
LDH: 261 IU/L — ABNORMAL HIGH (ref 121–224)
LDL Chol Calc (NIH): 159 mg/dL — ABNORMAL HIGH (ref 0–99)
Lymphocytes Absolute: 2.3 10*3/uL (ref 0.7–3.1)
Lymphs: 32 %
MCH: 28.7 pg (ref 26.6–33.0)
MCHC: 33.3 g/dL (ref 31.5–35.7)
MCV: 86 fL (ref 79–97)
Monocytes Absolute: 0.7 10*3/uL (ref 0.1–0.9)
Monocytes: 10 %
Neutrophils Absolute: 4.2 10*3/uL (ref 1.4–7.0)
Neutrophils: 55 %
Phosphorus: 3.9 mg/dL (ref 2.8–4.1)
Platelets: 312 10*3/uL (ref 150–450)
Potassium: 4.7 mmol/L (ref 3.5–5.2)
RBC: 5.43 x10E6/uL (ref 4.14–5.80)
RDW: 12.5 % (ref 11.6–15.4)
Sodium: 141 mmol/L (ref 134–144)
T3 Uptake Ratio: 31 % (ref 24–39)
T4, Total: 8.8 ug/dL (ref 4.5–12.0)
TSH: 4.81 u[IU]/mL — ABNORMAL HIGH (ref 0.450–4.500)
Total Protein: 7.3 g/dL (ref 6.0–8.5)
Triglycerides: 71 mg/dL (ref 0–149)
Uric Acid: 7 mg/dL (ref 3.8–8.4)
VLDL Cholesterol Cal: 12 mg/dL (ref 5–40)
WBC: 7.4 10*3/uL (ref 3.4–10.8)
eGFR: 72 mL/min/{1.73_m2} (ref >59)

## 2023-06-10 ENCOUNTER — Encounter: Payer: Self-pay | Admitting: Physician Assistant

## 2023-06-10 ENCOUNTER — Ambulatory Visit: Payer: Self-pay | Admitting: Physician Assistant

## 2023-06-10 VITALS — BP 127/88 | HR 77 | Temp 98.2°F | Resp 16 | Wt 210.0 lb

## 2023-06-10 DIAGNOSIS — Z0000 Encounter for general adult medical examination without abnormal findings: Secondary | ICD-10-CM

## 2023-06-10 NOTE — Progress Notes (Signed)
 Here for yearly physical and stated aware of high cholesterol and reports eating extremely healthy diet and low fat.  Takes probiotic daily as well.

## 2023-06-10 NOTE — Progress Notes (Signed)
 [facility]  . ____________________________________________   None    (approximate)  I have reviewed the triage vital signs and the nursing notes.   HISTORY  Chief Complaint No chief complaint on file.  Marland Kitchen  HPI Nathaniel Walker is a 38 y.o. male presents for annual physical exam.  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  Evaluation endocrinologist was normal.         No past medical history on file.  Patient Active Problem List   Diagnosis Date Noted   Chronic upper back pain 05/25/2019   Back pain 05/14/2019   Hearing abnormally acute 11/26/2018   Bilateral impacted cerumen 11/26/2018   Hearing loss of right ear due to cerumen impaction 11/26/2018   Shoulder pain 12/15/2015    Past Surgical History:  Procedure Laterality Date   SPINE SURGERY Bilateral 2023   VASECTOMY  03/2021    Prior to Admission medications   Not on File    Allergies Patient has no known allergies.  Family History  Problem Relation Age of Onset   Heart disease Father    Hypertension Father    Heart disease Maternal Grandfather    Heart disease Paternal Grandmother    Heart disease Paternal Grandfather    Pulmonary embolism Neg Hx     Social History Social History   Tobacco Use   Smoking status: Never   Smokeless tobacco: Never  Substance Use Topics   Alcohol use: No   Drug use: Never    Review of Systems  Constitutional: No fever/chills Eyes: No visual changes. ENT: No sore throat. Cardiovascular: Denies chest pain. Respiratory: Denies shortness of breath. Gastrointestinal: No abdominal pain.  No nausea, no vomiting.  No diarrhea.  No constipation. Genitourinary: Negative for dysuria. Musculoskeletal: Negative for back pain. Skin: Negative for rash. Neurological: Negative for headaches, focal weakness or numbness.  Endocrine: Hyperlipidemia  ____________________________________________   PHYSICAL EXAM:  VITAL  SIGNS:  Constitutional: Alert and oriented. Well appearing and in no acute distress. Eyes: Conjunctivae are normal. PERRL. EOMI. Head: Atraumatic. Nose: No congestion/rhinnorhea. Mouth/Throat: Mucous membranes are moist.  Oropharynx non-erythematous. Neck: No stridor.  No cervical spine tenderness to palpation. Hematological/Lymphatic/Immunilogical: No cervical lymphadenopathy. Cardiovascular: Normal rate, regular rhythm. Grossly normal heart sounds.  Good peripheral circulation. Respiratory: Normal respiratory effort.  No retractions. Lungs CTAB. Gastrointestinal: Soft and nontender. No distention. No abdominal bruits. No CVA tenderness. Genitourinary: Deferred Musculoskeletal: No lower extremity tenderness nor edema.  No joint effusions. Neurologic:  Normal speech and language. No gross focal neurologic deficits are appreciated. No gait instability. Skin:  Skin is warm, dry and intact. No rash noted. Psychiatric: Mood and affect are normal. Speech and behavior are normal.  ____________________________________________   0 Result Notes        Component Ref Range & Units (hover) 7 d ago 11 mo ago 1 yr ago 2 yr ago 3 yr ago  Color, UA yellow yellow Amber yellow Dark Yellow  Clarity, UA clear clear Clear clear Clear  Glucose, UA Negative Negative Negative Negative Negative  Bilirubin, UA neg neg Negative negative Negative  Ketones, UA + neg Negative negative Negative  Comment: 5mg /dL  Spec Grav, UA 4.034 7.425 1.025 1.025 >=1.030 Abnormal   Blood, UA neg neg negative negative Negative  pH, UA 6.0 6.0 6.0 6.0 6.0  Protein, UA Negative Negative Negative Negative Negative  Urobilinogen, UA 0.2 0.2 0.2 0.2 0.2  Nitrite, UA neg neg Negative neative Negative  Leukocytes, UA Negative  Negative Negative Negative Negative  Appearance    medium   Odor             .     View All Conversations on this Encounter               Component Ref Range & Units (hover) 7 d ago (06/03/23) 11  mo ago (07/10/22) 1 yr ago (10/12/21) 1 yr ago (10/12/21) 1 yr ago (08/16/21) 1 yr ago (07/18/21) 2 yr ago (07/20/20)  Glucose 94 100 High  123 High  CM   97 98 R  Uric Acid 7.0 6.1 CM    6.6 CM 5.5 CM  Comment:            Therapeutic target for gout patients: <6.0  BUN 19 15 15   16 19   Creatinine, Ser 1.30 High  1.07 1.30 High  R   1.16 1.10  eGFR 72 91    83 89  BUN/Creatinine Ratio 15 14    14 17   Sodium 141 141 136 R   140 144  Potassium 4.7 4.4 5.0 R   4.6 4.8  Chloride 103 104 101 R   103 106  Calcium 9.7 9.1 9.3 R   9.4 9.3  Phosphorus 3.9 3.5    3.5 3.1  Total Protein 7.3 6.9    7.3 7.0  Albumin 4.9 4.4    4.7 R 4.6 R  Globulin, Total 2.4 2.5    2.6 2.4  Bilirubin Total 2.4 High  1.0    1.4 High  1.5 High   Alkaline Phosphatase 54 50    44 50  LDH 261 High  180    186 212  AST 29 19    22 27   ALT 20 14    19  38  GGT 11 12    15 18   Iron 130 101    131 120  Cholesterol, Total 245 High  211 High     218 High  223 High   Triglycerides 71 66    82 59  HDL 74 61    66 63  VLDL Cholesterol Cal 12 12    14 10   LDL Chol Calc (NIH) 159 High  138 High     138 High  150 High   Chol/HDL Ratio 3.3 3.5 CM    3.3 CM 3.5 CM  Comment:                                   T. Chol/HDL Ratio                                             Men  Women                               1/2 Avg.Risk  3.4    3.3                                   Avg.Risk  5.0    4.4  2X Avg.Risk  9.6    7.1                                3X Avg.Risk 23.4   11.0  Estimated CHD Risk  < 0.5 0.5 CM     < 0.5 CM 0.6 CM  Comment: The CHD Risk is based on the T. Chol/HDL ratio. Other factors affect CHD Risk such as hypertension, smoking, diabetes, severe obesity, and family history of premature CHD.  TSH 4.810 High  4.610 High    3.710 6.140 High  6.370 High   T4, Total 8.8 9.4    8.7 8.6  T3 Uptake Ratio 31 31    29 28   Free Thyroxine Index 2.7 2.9    2.5 2.4  WBC 7.4 7.8  9.5 R  5.4 6.1  RBC  5.43 5.38  5.37 R  5.44 5.76  Hemoglobin 15.6 14.9  15.0 R  15.4 15.8  Hematocrit 46.9 45.7  46.4 R  46.0 50.0  MCV 86 85  86.4 R  85 87  MCH 28.7 27.7  27.9 R  28.3 27.4  MCHC 33.3 32.6  32.3 R  33.5 31.6  RDW 12.5 12.2  12.3 R  12.3 11.9  Platelets 312 372  281 R  282 306  Neutrophils 55 59  77 R  53 55  Lymphs 32 30    36 33  Monocytes 10 7    7 8   Eos 2 3    3 3   Basos 1 1    1 1   Neutrophils Absolute 4.2 4.6  7.3 R  2.9 3.4  Lymphocytes Absolute 2.3 2.4  1.2 R  1.9 2.0  Monocytes Absolute 0.7 0.6    0.4 0.5  EOS (ABSOLUTE) 0.1 0.2    0.2 0.2  Basophils Absolute 0.1 0.0  0.0 R  0.0 0.0  Immature Granulocytes 0 0  0 R  0 0  Immature Grans (Abs) 0.0 0.0    0.0 0.0               ____________________________________________  EKG  Normal sinus rhythm at 61 bpm ____________________________________________     ____________________________________________   INITIAL IMPRESSION / ASSESSMENT AND PLAN   As part of my medical decision making, I reviewed the following data within the electronic MEDICAL RECORD NUMBER      No acute findings on physical exam or EKG.  Labs reveal elevated cholesterol however triglycerides and HDLs are within normal limits.        ____________________________________________   FINAL CLINICAL IMPRESSION. Well exam    ED Discharge Orders     None        Note:  This document was prepared using Dragon voice recognition software and may include unintentional dictation errors.

## 2023-08-07 DIAGNOSIS — Z0000 Encounter for general adult medical examination without abnormal findings: Secondary | ICD-10-CM | POA: Diagnosis not present

## 2023-08-07 DIAGNOSIS — Z6827 Body mass index (BMI) 27.0-27.9, adult: Secondary | ICD-10-CM | POA: Diagnosis not present

## 2023-08-07 DIAGNOSIS — E038 Other specified hypothyroidism: Secondary | ICD-10-CM | POA: Diagnosis not present

## 2023-08-07 DIAGNOSIS — E7800 Pure hypercholesterolemia, unspecified: Secondary | ICD-10-CM | POA: Diagnosis not present

## 2023-12-11 DIAGNOSIS — D225 Melanocytic nevi of trunk: Secondary | ICD-10-CM | POA: Diagnosis not present

## 2023-12-11 DIAGNOSIS — L570 Actinic keratosis: Secondary | ICD-10-CM | POA: Diagnosis not present

## 2023-12-11 DIAGNOSIS — D1801 Hemangioma of skin and subcutaneous tissue: Secondary | ICD-10-CM | POA: Diagnosis not present

## 2023-12-11 DIAGNOSIS — L578 Other skin changes due to chronic exposure to nonionizing radiation: Secondary | ICD-10-CM | POA: Diagnosis not present

## 2023-12-11 DIAGNOSIS — W908XXD Exposure to other nonionizing radiation, subsequent encounter: Secondary | ICD-10-CM | POA: Diagnosis not present

## 2023-12-11 DIAGNOSIS — L821 Other seborrheic keratosis: Secondary | ICD-10-CM | POA: Diagnosis not present
